# Patient Record
Sex: Female | Born: 1960 | Race: Black or African American | Hispanic: No | Marital: Single | Smoking: Former smoker
Health system: Southern US, Community
[De-identification: ages and names within clinical notes are randomized; demographics above are authoritative.]

## PROBLEM LIST (undated history)

## (undated) DIAGNOSIS — G20A1 Parkinson's disease without dyskinesia, without mention of fluctuations: Secondary | ICD-10-CM

## (undated) DIAGNOSIS — H04129 Dry eye syndrome of unspecified lacrimal gland: Secondary | ICD-10-CM

## (undated) DIAGNOSIS — I639 Cerebral infarction, unspecified: Secondary | ICD-10-CM

## (undated) DIAGNOSIS — B351 Tinea unguium: Secondary | ICD-10-CM

## (undated) DIAGNOSIS — J45909 Unspecified asthma, uncomplicated: Secondary | ICD-10-CM

## (undated) DIAGNOSIS — J449 Chronic obstructive pulmonary disease, unspecified: Secondary | ICD-10-CM

## (undated) DIAGNOSIS — G20 Parkinson's disease: Secondary | ICD-10-CM

## (undated) DIAGNOSIS — F1910 Other psychoactive substance abuse, uncomplicated: Secondary | ICD-10-CM

## (undated) HISTORY — DX: Tinea unguium: B35.1

## (undated) HISTORY — DX: Dry eye syndrome of unspecified lacrimal gland: H04.129

## (undated) HISTORY — DX: Other psychoactive substance abuse, uncomplicated: F19.10

---

## 2015-11-26 DIAGNOSIS — G609 Hereditary and idiopathic neuropathy, unspecified: Secondary | ICD-10-CM

## 2015-11-26 HISTORY — DX: Hereditary and idiopathic neuropathy, unspecified: G60.9

## 2018-09-13 DIAGNOSIS — L219 Seborrheic dermatitis, unspecified: Secondary | ICD-10-CM

## 2018-09-13 HISTORY — DX: Seborrheic dermatitis, unspecified: L21.9

## 2021-05-02 ENCOUNTER — Emergency Department (HOSPITAL_COMMUNITY)
Admission: EM | Admit: 2021-05-02 | Discharge: 2021-05-02 | Disposition: A | Discharge status: Home / Self Care | Payer: Medicaid Other | Attending: Emergency Medicine | Admitting: Emergency Medicine

## 2021-05-02 ENCOUNTER — Encounter (HOSPITAL_COMMUNITY): Payer: Self-pay | Admitting: Emergency Medicine

## 2021-05-02 ENCOUNTER — Emergency Department (HOSPITAL_COMMUNITY): Payer: Medicaid Other

## 2021-05-02 ENCOUNTER — Other Ambulatory Visit: Payer: Self-pay

## 2021-05-02 DIAGNOSIS — G20 Parkinson's disease: Secondary | ICD-10-CM | POA: Insufficient documentation

## 2021-05-02 DIAGNOSIS — M25512 Pain in left shoulder: Secondary | ICD-10-CM | POA: Diagnosis not present

## 2021-05-02 DIAGNOSIS — M25531 Pain in right wrist: Secondary | ICD-10-CM | POA: Diagnosis not present

## 2021-05-02 DIAGNOSIS — R202 Paresthesia of skin: Secondary | ICD-10-CM | POA: Diagnosis not present

## 2021-05-02 DIAGNOSIS — Z87891 Personal history of nicotine dependence: Secondary | ICD-10-CM | POA: Diagnosis not present

## 2021-05-02 DIAGNOSIS — M2550 Pain in unspecified joint: Secondary | ICD-10-CM

## 2021-05-02 DIAGNOSIS — J449 Chronic obstructive pulmonary disease, unspecified: Secondary | ICD-10-CM | POA: Insufficient documentation

## 2021-05-02 DIAGNOSIS — J45909 Unspecified asthma, uncomplicated: Secondary | ICD-10-CM | POA: Diagnosis not present

## 2021-05-02 HISTORY — DX: Cerebral infarction, unspecified: I63.9

## 2021-05-02 HISTORY — DX: Parkinson's disease: G20

## 2021-05-02 HISTORY — DX: Parkinson's disease without dyskinesia, without mention of fluctuations: G20.A1

## 2021-05-02 HISTORY — DX: Unspecified asthma, uncomplicated: J45.909

## 2021-05-02 HISTORY — DX: Chronic obstructive pulmonary disease, unspecified: J44.9

## 2021-05-02 LAB — COMPREHENSIVE METABOLIC PANEL
ALT: 5 U/L (ref 0–44)
AST: 15 U/L (ref 15–41)
Albumin: 4 g/dL (ref 3.5–5.0)
Alkaline Phosphatase: 59 U/L (ref 38–126)
Anion gap: 10 (ref 5–15)
BUN: 9 mg/dL (ref 8–23)
CO2: 23 mmol/L (ref 22–32)
Calcium: 9.4 mg/dL (ref 8.9–10.3)
Chloride: 102 mmol/L (ref 98–111)
Creatinine, Ser: 0.89 mg/dL (ref 0.44–1.00)
GFR, Estimated: >60 mL/min (ref >60)
Glucose, Bld: 123 mg/dL — ABNORMAL HIGH (ref 70–99)
Potassium: 3.4 mmol/L — ABNORMAL LOW (ref 3.5–5.1)
Sodium: 135 mmol/L (ref 135–145)
Total Bilirubin: 1.4 mg/dL — ABNORMAL HIGH (ref 0.3–1.2)
Total Protein: 7.4 g/dL (ref 6.5–8.1)

## 2021-05-02 LAB — ETHANOL: Alcohol, Ethyl (B): <10 mg/dL (ref <10)

## 2021-05-02 LAB — DIFFERENTIAL
Abs Immature Granulocytes: 0.05 10*3/uL (ref 0.00–0.07)
Basophils Absolute: 0 10*3/uL (ref 0.0–0.1)
Basophils Relative: 0 %
Eosinophils Absolute: 0 10*3/uL (ref 0.0–0.5)
Eosinophils Relative: 0 %
Immature Granulocytes: 1 %
Lymphocytes Relative: 9 %
Lymphs Abs: 0.9 10*3/uL (ref 0.7–4.0)
Monocytes Absolute: 0.8 10*3/uL (ref 0.1–1.0)
Monocytes Relative: 8 %
Neutro Abs: 8.5 10*3/uL — ABNORMAL HIGH (ref 1.7–7.7)
Neutrophils Relative %: 82 %

## 2021-05-02 LAB — PROTIME-INR
INR: 1.1 (ref 0.8–1.2)
Prothrombin Time: 14.4 seconds (ref 11.4–15.2)

## 2021-05-02 LAB — CBC
HCT: 38 % (ref 36.0–46.0)
Hemoglobin: 12.8 g/dL (ref 12.0–15.0)
MCH: 28.8 pg (ref 26.0–34.0)
MCHC: 33.7 g/dL (ref 30.0–36.0)
MCV: 85.6 fL (ref 80.0–100.0)
Platelets: 265 10*3/uL (ref 150–400)
RBC: 4.44 MIL/uL (ref 3.87–5.11)
RDW: 12.9 % (ref 11.5–15.5)
WBC: 10.3 10*3/uL (ref 4.0–10.5)
nRBC: 0 % (ref 0.0–0.2)

## 2021-05-02 LAB — I-STAT CHEM 8, ED
BUN: 9 mg/dL (ref 8–23)
Calcium, Ion: 1.19 mmol/L (ref 1.15–1.40)
Chloride: 102 mmol/L (ref 98–111)
Creatinine, Ser: 0.8 mg/dL (ref 0.44–1.00)
Glucose, Bld: 108 mg/dL — ABNORMAL HIGH (ref 70–99)
HCT: 37 % (ref 36.0–46.0)
Hemoglobin: 12.6 g/dL (ref 12.0–15.0)
Potassium: 3.5 mmol/L (ref 3.5–5.1)
Sodium: 138 mmol/L (ref 135–145)
TCO2: 23 mmol/L (ref 22–32)

## 2021-05-02 LAB — TROPONIN I (HIGH SENSITIVITY): Troponin I (High Sensitivity): 5 ng/L (ref <18)

## 2021-05-02 LAB — APTT: aPTT: 31 seconds (ref 24–36)

## 2021-05-02 LAB — SEDIMENTATION RATE: Sed Rate: 28 mm/hr — ABNORMAL HIGH (ref 0–22)

## 2021-05-02 MED ORDER — PREDNISONE 20 MG PO TABS
40.0000 mg | ORAL_TABLET | Freq: Once | ORAL | Status: AC
  Administered 2021-05-02: 40 mg via ORAL
  Filled 2021-05-02: qty 2

## 2021-05-02 MED ORDER — HYDROCODONE-ACETAMINOPHEN 5-325 MG PO TABS
1.0000 | ORAL_TABLET | Freq: Once | ORAL | Status: AC
  Administered 2021-05-02: 1 via ORAL
  Filled 2021-05-02: qty 1

## 2021-05-02 MED ORDER — PREDNISONE 20 MG PO TABS: 40.0000 mg | ORAL_TABLET | Freq: Every day | ORAL | 0 refills | Status: DC

## 2021-05-02 MED ORDER — OXYCODONE-ACETAMINOPHEN 5-325 MG PO TABS: 1.0000 | ORAL_TABLET | Freq: Four times a day (QID) | ORAL | 0 refills | Status: DC | PRN

## 2021-05-02 NOTE — ED Notes (Signed)
Discharge instructions gone over with patient. Provided pt with graham crackers and cranberry juice per pt request.

## 2021-05-02 NOTE — ED Notes (Signed)
Patient transported to X-ray 

## 2021-05-02 NOTE — ED Provider Notes (Signed)
[facility] EMERGENCY DEPARTMENT Provider Note   CSN: [id number] Arrival date & time: 05/02/21  1447     History Chief Complaint  Patient presents with  . Extremity Weakness  . Numbness    Madai Nuccio is a 60 y.o. female.  Patient is a 60 year old female with a history of COPD, Parkinson's disease since 2018 who is presenting today with several complaints.  Patient reports 4 days ago she started developing pain in her right wrist which initially went away for approximately 24 hours and then returned yesterday and this morning when she woke up there was also pain in her left shoulder that radiates down her arm.  She has noticed that today because of the pain being so severe she is had a difficult time holding her coffee cup because it feels too heavy and causes more pain as well as trying to get in and out of her car has been difficult because her upper body is painful.  She has no neck pain, chest pain, back pain.  She has not had fever cough or congestion.  She thinks that her wrist has looked swollen but has not been red.  No prior history of gout or recent procedures done to these areas.  She has no prior history of arthritis but reports that she does box which is a repetitive movement but does not remember injuring her arm while doing this.  She was trying to play tennis with her grandson today but her arm was hurting so badly she was unable.  She feels like it is tingling but denies any numbness.  She reports it feels weak all over but then states that it is pain.  She tried taking Tylenol yesterday but it did not help.  No new medications.  She is still taking her carbidopa levodopa.  The history is provided by the patient.  Extremity Weakness       Past Medical History:  Diagnosis Date  . Asthma   . COPD (chronic obstructive pulmonary disease) (HCC)   . Parkinson disease (HCC)   . Stroke [facility])     There are no problems to display for this  patient.   Past Surgical History:  Procedure Laterality Date  . CESAREAN SECTION       OB History   No obstetric history on file.     No family history on file.  Social History   Tobacco Use  . Smoking status: Former Games developer  . Smokeless tobacco: Never Used  Substance Use Topics  . Alcohol use: Yes  . Drug use: Never    Home Medications Prior to Admission medications   Medication Sig Start Date End Date Taking? Authorizing Provider  albuterol (VENTOLIN HFA) 108 (90 Base) MCG/ACT inhaler Inhale 1 puff into the lungs every 6 (six) hours as needed for shortness of breath. 05/12/19  Yes [provider]  carbidopa-levodopa (SINEMET IR) 25-100 MG tablet Take 1 tablet by mouth 3 (three) times daily. 03/08/20  Yes [provider]  cycloSPORINE (RESTASIS) 0.05 % ophthalmic emulsion Place 1 drop into both eyes daily. 03/14/21 06/12/21 Yes [provider]  trihexyphenidyl (ARTANE) 2 MG tablet Take 2 mg by mouth 3 (three) times daily with meals. 03/08/20  Yes [provider]    Allergies    Patient has no known allergies.  Review of Systems   Review of Systems  Musculoskeletal: Positive for extremity weakness.  All other systems reviewed and are negative.   Physical Exam Updated Vital  Signs BP 134/79 (BP Location: Right Arm)   Pulse 60   Temp 98.3 F (36.8 C) (Oral)   Resp 19   SpO2 100%   Physical Exam Vitals and nursing note reviewed.  Constitutional:      General: She is not in acute distress.    Appearance: Normal appearance. She is well-developed.  HENT:     Head: Normocephalic and atraumatic.  Eyes:     Pupils: Pupils are equal, round, and reactive to light.  Cardiovascular:     Rate and Rhythm: Normal rate and regular rhythm.     Heart sounds: Normal heart sounds. No murmur heard. No friction rub.  Pulmonary:     Effort: Pulmonary effort is normal.     Breath sounds: Normal breath sounds. No wheezing or rales.  Abdominal:      General: Bowel sounds are normal. There is no distension.     Palpations: Abdomen is soft.     Tenderness: There is no abdominal tenderness. There is no guarding or rebound.  Musculoskeletal:        General: Tenderness present. Normal range of motion.     Cervical back: Normal range of motion and neck supple.     Comments: No edema.  Rigidity with passive flexion and extension of the right wrist with minimal pain but no warmth or erythema.  Tenderness with palpation of the left shoulder with mild decreased range of motion.  No significant joint swelling noted in any of the joints of the upper extremity.  2+ radial pulse bilaterally.  Skin:    General: Skin is warm and dry.     Findings: No rash.  Neurological:     Mental Status: She is alert and oriented to person, place, and time.     Cranial Nerves: No cranial nerve deficit.     Comments: Resting pill-rolling tremor and slowed movements.  Mild rigidity with passive range of motion of the joints.  Mild decreased handgrip strength bilaterally, 4 out of 5.  5 out of 5 strength in bilateral lower extremities.  Psychiatric:        Mood and Affect: Mood normal.        Behavior: Behavior normal.     ED Results / Procedures / Treatments   Labs (all labs ordered are listed, but only abnormal results are displayed) Labs Reviewed  DIFFERENTIAL - Abnormal; Notable for the following components:      Result Value   Neutro Abs 8.5 (*)    All other components within normal limits  COMPREHENSIVE METABOLIC PANEL - Abnormal; Notable for the following components:   Potassium 3.4 (*)    Glucose, Bld 123 (*)    Total Bilirubin 1.4 (*)    All other components within normal limits  SEDIMENTATION RATE - Abnormal; Notable for the following components:   Sed Rate 28 (*)    All other components within normal limits  I-STAT CHEM 8, ED - Abnormal; Notable for the following components:   Glucose, Bld 108 (*)    All other components within normal limits   ETHANOL  PROTIME-INR  APTT  CBC  RAPID URINE DRUG SCREEN, HOSP PERFORMED  URINALYSIS, ROUTINE W REFLEX MICROSCOPIC  TROPONIN I (HIGH SENSITIVITY)  TROPONIN I (HIGH SENSITIVITY)    EKG EKG Interpretation  Date/Time:  Thursday May 02 2021 14:52:20 EDT Ventricular Rate:  73 PR Interval:  122 QRS Duration: 74 QT Interval:  364 QTC Calculation: 401 R Axis:   86 Text Interpretation: Normal sinus  rhythm T wave abnormality, consider inferior ischemia T wave abnormality, consider anterolateral ischemia No previous tracing Confirmed by Gwyneth Sprout ([id number]) on 05/02/2021 7:07:31 PM   Radiology DG Wrist Complete Right  Result Date: 05/02/2021 CLINICAL DATA:  Arm numbness EXAM: RIGHT WRIST - COMPLETE 3+ VIEW COMPARISON:  None. FINDINGS: There is no evidence of fracture or dislocation. There is no evidence of arthropathy or other focal bone abnormality. Soft tissues are unremarkable. IMPRESSION: No acute abnormality noted. Electronically Signed   By: Alcide Clever M.D.   On: 05/02/2021 20:33   CT Head Wo Contrast  Result Date: 05/02/2021 CLINICAL DATA:  Transient ischemic attack, bilateral arm weakness worse on the LEFT, history of transient ischemic attack in a 60 year old female. EXAM: CT HEAD WITHOUT CONTRAST TECHNIQUE: Contiguous axial images were obtained from the base of the skull through the vertex without intravenous contrast. COMPARISON:  None. FINDINGS: Brain: No evidence of acute infarction, hemorrhage, hydrocephalus, extra-axial collection or mass lesion/mass effect. Findings of empty sella, reported on prior MRI and CT evaluations dating back to 2016 from [facility]. Vascular: No hyperdense vessel or unexpected calcification. Skull: Normal. Negative for fracture or focal lesion. Sinuses/Orbits: No acute finding. Other: None. IMPRESSION: No acute intracranial abnormality. Electronically Signed   By: Donzetta Kohut M.D.   On: 05/02/2021 16:45    DG Shoulder Left  Result Date: 05/02/2021 CLINICAL DATA:  Shoulder pain, initial encounter EXAM: LEFT SHOULDER - 2+ VIEW COMPARISON:  None. FINDINGS: Mild degenerative changes of the acromioclavicular joint are noted. No acute fracture or dislocation is seen. Underlying bony thorax is within normal limits. IMPRESSION: No acute abnormality is noted. Electronically Signed   By: Alcide Clever M.D.   On: 05/02/2021 20:34    Procedures Procedures   Medications Ordered in ED Medications  HYDROcodone-acetaminophen (NORCO/VICODIN) 5-325 MG per tablet 1 tablet (has no administration in time range)    ED Course  I have reviewed the triage vital signs and the nursing notes.  Pertinent labs & imaging results that were available during my care of the patient were reviewed by me and considered in my medical decision making (see chart for details).    MDM Rules/Calculators/A&P                          60 year old female presenting today with pain in her upper extremities.  She has no neck pain or radiation of pain from the neck down.  She has minimal decreased grip bilaterally but reports pain is inhibiting further grip.  Sensation is intact.  Patient is able to ambulate with no notable weakness in the lower extremities.  Patient does not have findings concerning for gout, septic arthritis.  Low suspicion for gonococcus.  Patient has no findings consistent with stroke as symptoms are bilateral and more related to pain.  She does have findings consistent with Parkinson's but seems to be more of her baseline.  Concern for inflammation due to recurrent movements as patient has been boxing.  Plain films without acute findings, head CT negative, labs are reassuring with negative troponin, CBC and BMP.  Coags are within normal limits.  Sed rate is pending.  Patient's EKG does show anterior T wave inversions however no old in our system but when looking back at [facility] she had an EKG done last year that also reported  T wave inversion and suspect this is most likely old.  Patient given pain control.  9:49 PM Plain films wnl.  Sed rat age adjusted normal.  Will d/c home with short course of steroids and pain meds.  Will have  F/u with pcp or return to the ER if symptoms worsening.  MDM Number of Diagnoses or Management Options   Amount and/or Complexity of Data Reviewed Clinical lab tests: ordered and reviewed Tests in the radiology section of CPT: ordered and reviewed Tests in the medicine section of CPT: reviewed and ordered Independent visualization of images, tracings, or specimens: yes  Patient Progress Patient progress: stable   Final Clinical Impression(s) / ED Diagnoses Final diagnoses:  Paresthesias  Polyarthralgia    Rx / DC Orders ED Discharge Orders         Ordered    oxyCODONE-acetaminophen (PERCOCET/ROXICET) 5-325 MG tablet  Every 6 hours PRN        05/02/21 2152    predniSONE (DELTASONE) 20 MG tablet  Daily        05/02/21 2152           Gwyneth SproutPlunkett, Shaylen Nephew, MD 05/02/21 2154

## 2021-05-02 NOTE — ED Notes (Signed)
Back to room.

## 2021-05-02 NOTE — Discharge Instructions (Addendum)
Your blood work today looked good.  Your x-ray of your wrist was normal and your shoulder x-rayHad a little bit of arthritis.  Everything with your heart was normal and your head CT showed no signs of stroke.  The pain you are experiencing may be related to the boxing or overdoing it.  Avoid any boxing or repetitive movements.  If you have worsening symptoms, fever, redness or swelling of the joints you should return to the emergency room.  Otherwise you will need to follow-up with neurology and a regular doctor here.

## 2021-05-02 NOTE — ED Provider Notes (Signed)
Emergency Medicine Provider Triage Evaluation Note  Sarah Mcconnell , a 60 y.o. female  was evaluated in triage.  Pt complains of numbness and pain in her extremities since 8 PM on 05/01/2021.  States that the symptoms suddenly occurred last night.  Reports having pain and paresthesias to her right hand and her left arm.  States that when she tries to move her upper extremity she has pain.  She denies any head injury, injury or trauma, recent head injury, vision changes.  Denies any loss of vision.  She feels as though the symptoms are more paresthesias than actual loss of sensation.  Review of Systems  Positive: Paresthesias, extremity pain Negative: Headache, vision changes  Physical Exam  BP 123/78   Pulse (!) 51   Temp 98.9 F (37.2 C) (Oral)   Resp 16   SpO2 97%  Gen:   Awake, no distress   Resp:  Normal effort MSK:   Moves extremities without difficulty Other:   No facial asymmetry.  Patient reports pain with movement of bilateral upper extremities giving poor effort when assessing grip strength.  Normal sensation to light touch of face, bilateral upper and lower extremities.  No aphasia.  Symmetric tongue protrusion  Medical Decision Making  Medically screening exam initiated at 2:59 PM.  Appropriate orders placed.  Austyn Garcialopez was informed that the remainder of the evaluation will be completed by another provider, this initial triage assessment does not replace that evaluation, and the importance of remaining in the ED until their evaluation is complete.  No criteria for code stroke met at this time due to timing and symptoms but will initiate work-up and imaging   Khatri, Hina, PA-C 05/02/21 1502    Ray, Danielle, MD 05/06/21 1408  

## 2021-05-02 NOTE — ED Triage Notes (Signed)
Pt presents with difficulty getting off the toilet last night around 8 pm. She reports left upper arm numbness and right forearm numbness. Sensation intact bilaterally, no facial drooping or headache. Clear speech, no visual disturbances. A/o x 4

## 2021-06-04 ENCOUNTER — Emergency Department (HOSPITAL_COMMUNITY)
Admission: EM | Admit: 2021-06-04 | Discharge: 2021-06-05 | Disposition: A | Discharge status: Home / Self Care | Payer: Medicaid Other | Attending: Emergency Medicine | Admitting: Emergency Medicine

## 2021-06-04 ENCOUNTER — Other Ambulatory Visit: Payer: Self-pay

## 2021-06-04 ENCOUNTER — Encounter (HOSPITAL_COMMUNITY): Payer: Self-pay | Admitting: Emergency Medicine

## 2021-06-04 DIAGNOSIS — N390 Urinary tract infection, site not specified: Secondary | ICD-10-CM | POA: Diagnosis not present

## 2021-06-04 DIAGNOSIS — Z20822 Contact with and (suspected) exposure to covid-19: Secondary | ICD-10-CM | POA: Diagnosis not present

## 2021-06-04 DIAGNOSIS — J449 Chronic obstructive pulmonary disease, unspecified: Secondary | ICD-10-CM | POA: Insufficient documentation

## 2021-06-04 DIAGNOSIS — M79622 Pain in left upper arm: Secondary | ICD-10-CM | POA: Insufficient documentation

## 2021-06-04 DIAGNOSIS — M2550 Pain in unspecified joint: Secondary | ICD-10-CM

## 2021-06-04 DIAGNOSIS — M79621 Pain in right upper arm: Secondary | ICD-10-CM | POA: Diagnosis not present

## 2021-06-04 DIAGNOSIS — R070 Pain in throat: Secondary | ICD-10-CM | POA: Insufficient documentation

## 2021-06-04 DIAGNOSIS — Z87891 Personal history of nicotine dependence: Secondary | ICD-10-CM | POA: Diagnosis not present

## 2021-06-04 DIAGNOSIS — J45909 Unspecified asthma, uncomplicated: Secondary | ICD-10-CM | POA: Insufficient documentation

## 2021-06-04 NOTE — ED Provider Notes (Signed)
Emergency Medicine Provider Triage Evaluation Note  Sarah Mcconnell , a 60 y.o. female  was evaluated in triage.  Pt complains of joint pain.  States all of her joints hurt.  She was seen here about 1 month ago and told she had arthritis and started on prednisone.  States she is still having pain.  Denies fever, chills, sweats, cough, URI symptoms, vomiting, diarrhea.  Review of Systems  Positive: Joint pains Negative: Chest pain, SOB  Physical Exam  BP 132/72 (BP Location: Left Arm)   Pulse 61   Temp 99.7 F (37.6 C) (Oral)   Resp 18   SpO2 95%   Gen:   Awake, no distress   Resp:  Normal effort  MSK:   Moves extremities without difficulty, ambulatory with steady gait  Medical Decision Making  Medically screening exam initiated at 11:29 PM.  Appropriate orders placed.  Sarah Mcconnell was informed that the remainder of the evaluation will be completed by another provider, this initial triage assessment does not replace that evaluation, and the importance of remaining in the ED until their evaluation is complete.  Will checks labs, UA.   Sarah Hatchet, PA-C 06/04/21 2334    Sarah Conn, MD 06/05/21 [phone]

## 2021-06-04 NOTE — ED Triage Notes (Signed)
Pt c/o generalized joint pain. Denies fever/cough.

## 2021-06-05 LAB — URINALYSIS, ROUTINE W REFLEX MICROSCOPIC
Bilirubin Urine: NEGATIVE
Glucose, UA: NEGATIVE mg/dL
Hgb urine dipstick: NEGATIVE
Ketones, ur: 5 mg/dL — AB
Nitrite: NEGATIVE
Protein, ur: NEGATIVE mg/dL
Specific Gravity, Urine: 1.013 (ref 1.005–1.030)
pH: 6 (ref 5.0–8.0)

## 2021-06-05 LAB — CBC WITH DIFFERENTIAL/PLATELET
Abs Immature Granulocytes: 0.04 10*3/uL (ref 0.00–0.07)
Basophils Absolute: 0 10*3/uL (ref 0.0–0.1)
Basophils Relative: 0 %
Eosinophils Absolute: 0 10*3/uL (ref 0.0–0.5)
Eosinophils Relative: 0 %
HCT: 37.5 % (ref 36.0–46.0)
Hemoglobin: 12.4 g/dL (ref 12.0–15.0)
Immature Granulocytes: 0 %
Lymphocytes Relative: 8 %
Lymphs Abs: 0.7 10*3/uL (ref 0.7–4.0)
MCH: 29.3 pg (ref 26.0–34.0)
MCHC: 33.1 g/dL (ref 30.0–36.0)
MCV: 88.7 fL (ref 80.0–100.0)
Monocytes Absolute: 0.6 10*3/uL (ref 0.1–1.0)
Monocytes Relative: 6 %
Neutro Abs: 8.4 10*3/uL — ABNORMAL HIGH (ref 1.7–7.7)
Neutrophils Relative %: 86 %
Platelets: 337 10*3/uL (ref 150–400)
RBC: 4.23 MIL/uL (ref 3.87–5.11)
RDW: 12.9 % (ref 11.5–15.5)
WBC: 9.8 10*3/uL (ref 4.0–10.5)
nRBC: 0 % (ref 0.0–0.2)

## 2021-06-05 LAB — BASIC METABOLIC PANEL
Anion gap: 8 (ref 5–15)
BUN: 7 mg/dL — ABNORMAL LOW (ref 8–23)
CO2: 25 mmol/L (ref 22–32)
Calcium: 9.1 mg/dL (ref 8.9–10.3)
Chloride: 101 mmol/L (ref 98–111)
Creatinine, Ser: 0.84 mg/dL (ref 0.44–1.00)
GFR, Estimated: >60 mL/min (ref >60)
Glucose, Bld: 176 mg/dL — ABNORMAL HIGH (ref 70–99)
Potassium: 3.7 mmol/L (ref 3.5–5.1)
Sodium: 134 mmol/L — ABNORMAL LOW (ref 135–145)

## 2021-06-05 LAB — RESP PANEL BY RT-PCR (FLU A&B, COVID) ARPGX2
Influenza A by PCR: NEGATIVE
Influenza B by PCR: NEGATIVE
SARS Coronavirus 2 by RT PCR: NEGATIVE

## 2021-06-05 MED ORDER — CEPHALEXIN 500 MG PO CAPS: 500.0000 mg | ORAL_CAPSULE | Freq: Four times a day (QID) | ORAL | 0 refills | Status: AC

## 2021-06-05 MED ORDER — PREDNISONE 10 MG PO TABS: 20.0000 mg | ORAL_TABLET | Freq: Every day | ORAL | 0 refills | Status: DC

## 2021-06-05 MED ORDER — IBUPROFEN 800 MG PO TABS
800.0000 mg | ORAL_TABLET | Freq: Once | ORAL | Status: AC
  Administered 2021-06-05: 800 mg via ORAL
  Filled 2021-06-05: qty 2

## 2021-06-05 MED ORDER — PREDNISONE 20 MG PO TABS
20.0000 mg | ORAL_TABLET | Freq: Once | ORAL | Status: AC
  Administered 2021-06-05: 20 mg via ORAL
  Filled 2021-06-05: qty 1

## 2021-06-05 MED ORDER — CEPHALEXIN 250 MG PO CAPS
500.0000 mg | ORAL_CAPSULE | Freq: Once | ORAL | Status: AC
  Administered 2021-06-05: 500 mg via ORAL
  Filled 2021-06-05: qty 2

## 2021-06-05 NOTE — ED Provider Notes (Signed)
[facility] EMERGENCY DEPARTMENT Provider Note   CSN: [id number] Arrival date & time: 06/04/21  2300     History Chief Complaint  Patient presents with   Joint Pain    Sarah Mcconnell is a 60 y.o. female.  The history is provided by the patient and medical records.  Sarah Mcconnell is a 60 y.o. female who presents to the Emergency Department complaining of joint pain.  She presents to the ED complaining of several days of aching and throbbing pain to bilateral arms. She states that the pain is migratory and is located in her shoulders, elbows and wrist. Pain is worse with movement. She reports no fevers at home but does have a temperature to 100.3 in the department. No cough, difficulty breathing. She does have mild throat discomfort. No nausea, vomiting, diarrhea, dysuria, abdominal pain. She has a history of Parkinson's. She was recently treated with a course of steroids for joint pain and had significant improvement in her symptoms. When those steroids were discontinued her symptoms returned.     Past Medical History:  Diagnosis Date   Asthma    COPD (chronic obstructive pulmonary disease) (HCC)    Parkinson disease (HCC)    Stroke (HCC)     There are no problems to display for this patient.   Past Surgical History:  Procedure Laterality Date   CESAREAN SECTION       OB History   No obstetric history on file.     No family history on file.  Social History   Tobacco Use   Smoking status: Former    Pack years: 0.00   Smokeless tobacco: Never  Substance Use Topics   Alcohol use: Yes   Drug use: Never    Home Medications Prior to Admission medications   Medication Sig Start Date End Date Taking? Authorizing Provider  cephALEXin (KEFLEX) 500 MG capsule Take 1 capsule (500 mg total) by mouth 4 (four) times daily for 7 days. 06/05/21 06/12/21 Yes Tilden Fossa, MD  predniSONE (DELTASONE) 10 MG tablet Take 2 tablets (20 mg total) by mouth daily.  06/05/21  Yes Tilden Fossa, MD  albuterol (VENTOLIN HFA) 108 (90 Base) MCG/ACT inhaler Inhale 1 puff into the lungs every 6 (six) hours as needed for shortness of breath. 05/12/19   [provider]  carbidopa-levodopa (SINEMET IR) 25-100 MG tablet Take 1 tablet by mouth 3 (three) times daily. 03/08/20   [provider]  cycloSPORINE (RESTASIS) 0.05 % ophthalmic emulsion Place 1 drop into both eyes daily. 03/14/21 06/12/21  [provider]  oxyCODONE-acetaminophen (PERCOCET/ROXICET) 5-325 MG tablet Take 1 tablet by mouth every 6 (six) hours as needed for severe pain. 05/02/21   Gwyneth Sprout, MD  trihexyphenidyl (ARTANE) 2 MG tablet Take 2 mg by mouth 3 (three) times daily with meals. 03/08/20   [provider]    Allergies    Patient has no known allergies.  Review of Systems   Review of Systems  All other systems reviewed and are negative.  Physical Exam Updated Vital Signs BP 126/80 (BP Location: Left Arm)   Pulse 65   Temp 100.3 F (37.9 C) (Oral)   Resp 16   SpO2 100%   Physical Exam Vitals and nursing note reviewed.  Constitutional:      Appearance: She is well-developed.  HENT:     Head: Normocephalic and atraumatic.  Cardiovascular:     Rate and Rhythm: Normal rate and regular rhythm.     Heart sounds:  No murmur heard. Pulmonary:     Effort: Pulmonary effort is normal. No respiratory distress.     Breath sounds: Normal breath sounds.  Abdominal:     Palpations: Abdomen is soft.     Tenderness: There is no abdominal tenderness. There is no guarding or rebound.  Musculoskeletal:     Comments: She has mild tenderness to palpation throughout bilateral upper extremities. There is no overlying erythema. 2+ radial pulses bilaterally. She is unable to fully range the right shoulder. She is able to cross midline with bilateral upper extremities and she is able to fully range the left shoulder.  Skin:    General: Skin is warm and dry.   Neurological:     Mental Status: She is alert and oriented to person, place, and time.     Comments: Five out of five grip strength bilaterally. She does have a resting tremor to bilateral hands.  Psychiatric:        Behavior: Behavior normal.    ED Results / Procedures / Treatments   Labs (all labs ordered are listed, but only abnormal results are displayed) Labs Reviewed  CBC WITH DIFFERENTIAL/PLATELET - Abnormal; Notable for the following components:      Result Value   Neutro Abs 8.4 (*)    All other components within normal limits  BASIC METABOLIC PANEL - Abnormal; Notable for the following components:   Sodium 134 (*)    Glucose, Bld 176 (*)    BUN 7 (*)    All other components within normal limits  URINALYSIS, ROUTINE W REFLEX MICROSCOPIC - Abnormal; Notable for the following components:   APPearance HAZY (*)    Ketones, ur 5 (*)    Leukocytes,Ua TRACE (*)    Bacteria, UA MANY (*)    All other components within normal limits  RESP PANEL BY RT-PCR (FLU A&B, COVID) ARPGX2  URINE CULTURE    EKG None  Radiology No results found.  Procedures Procedures   Medications Ordered in ED Medications  cephALEXin (KEFLEX) capsule 500 mg (has no administration in time range)  predniSONE (DELTASONE) tablet 20 mg (has no administration in time range)  ibuprofen (ADVIL) tablet 800 mg (800 mg Oral Given 06/05/21 0848)    ED Course  I have reviewed the triage vital signs and the nursing notes.  Pertinent labs & imaging results that were available during my care of the patient were reviewed by me and considered in my medical decision making (see chart for details).    MDM Rules/Calculators/A&P                         patient here for evaluation of joint pains. She did recently have a steroid course for similar symptoms. No evidence of septic arthritis. UA is concerning for UTI, likely contributing to patients low-grade temperature. Will start on antibiotics for UTI as well as  low-dose steroids for her arthralgia. Discussed with patient importance of PCP follow-up, may need rheumatology referral. Return precautions discussed.  Final Clinical Impression(s) / ED Diagnoses Final diagnoses:  Polyarthralgia  Acute UTI    Rx / DC Orders ED Discharge Orders          Ordered    cephALEXin (KEFLEX) 500 MG capsule  4 times daily        06/05/21 1023    predniSONE (DELTASONE) 10 MG tablet  Daily        06/05/21 1023  Tilden Fossa, MD 06/05/21 1029

## 2021-06-06 LAB — URINE CULTURE: Culture: 50000 — AB

## 2021-07-09 ENCOUNTER — Other Ambulatory Visit: Payer: Self-pay | Admitting: Family Medicine

## 2021-07-09 DIAGNOSIS — Z1231 Encounter for screening mammogram for malignant neoplasm of breast: Secondary | ICD-10-CM

## 2021-07-10 ENCOUNTER — Other Ambulatory Visit: Payer: Self-pay | Admitting: Family Medicine

## 2021-07-10 DIAGNOSIS — M50021 Cervical disc disorder at C4-C5 level with myelopathy: Secondary | ICD-10-CM

## 2021-07-22 ENCOUNTER — Ambulatory Visit
Admission: RE | Admit: 2021-07-22 | Discharge: 2021-07-22 | Disposition: A | Discharge status: Home / Self Care | Payer: Medicaid Other | Source: Ambulatory Visit | Attending: Family Medicine | Admitting: Family Medicine

## 2021-07-22 ENCOUNTER — Other Ambulatory Visit: Payer: Self-pay

## 2021-07-22 DIAGNOSIS — M50021 Cervical disc disorder at C4-C5 level with myelopathy: Secondary | ICD-10-CM

## 2021-08-29 ENCOUNTER — Ambulatory Visit: Payer: Medicaid Other

## 2021-08-30 ENCOUNTER — Encounter: Payer: Medicaid Other | Admitting: Obstetrics and Gynecology

## 2021-09-24 ENCOUNTER — Ambulatory Visit: Payer: Medicaid Other | Admitting: Obstetrics and Gynecology

## 2021-09-24 ENCOUNTER — Encounter: Payer: Medicaid Other | Admitting: Obstetrics and Gynecology

## 2021-09-24 ENCOUNTER — Ambulatory Visit: Payer: Medicaid Other

## 2021-09-30 ENCOUNTER — Encounter: Payer: Self-pay | Admitting: *Deleted

## 2021-10-01 ENCOUNTER — Ambulatory Visit: Payer: Medicaid Other | Admitting: Neurology

## 2021-10-01 ENCOUNTER — Encounter: Payer: Self-pay | Admitting: Neurology

## 2021-10-02 ENCOUNTER — Ambulatory Visit: Payer: Medicaid Other | Admitting: Neurology

## 2021-10-02 ENCOUNTER — Ambulatory Visit: Payer: Medicaid Other

## 2021-10-07 ENCOUNTER — Other Ambulatory Visit: Payer: Self-pay

## 2021-10-07 ENCOUNTER — Ambulatory Visit: Payer: Medicaid Other | Admitting: Obstetrics and Gynecology

## 2021-10-07 ENCOUNTER — Inpatient Hospital Stay (HOSPITAL_COMMUNITY)
Admission: EM | Admit: 2021-10-07 | Discharge: 2021-10-13 | DRG: 872 | Disposition: A | Discharge status: Home / Self Care | Payer: Medicaid Other | Attending: Internal Medicine | Admitting: Internal Medicine

## 2021-10-07 ENCOUNTER — Emergency Department (HOSPITAL_COMMUNITY): Payer: Medicaid Other

## 2021-10-07 ENCOUNTER — Ambulatory Visit: Payer: Medicaid Other

## 2021-10-07 DIAGNOSIS — G609 Hereditary and idiopathic neuropathy, unspecified: Secondary | ICD-10-CM | POA: Diagnosis present

## 2021-10-07 DIAGNOSIS — A419 Sepsis, unspecified organism: Principal | ICD-10-CM | POA: Diagnosis present

## 2021-10-07 DIAGNOSIS — Z87891 Personal history of nicotine dependence: Secondary | ICD-10-CM

## 2021-10-07 DIAGNOSIS — E876 Hypokalemia: Secondary | ICD-10-CM | POA: Diagnosis present

## 2021-10-07 DIAGNOSIS — Z20822 Contact with and (suspected) exposure to covid-19: Secondary | ICD-10-CM | POA: Diagnosis present

## 2021-10-07 DIAGNOSIS — J189 Pneumonia, unspecified organism: Secondary | ICD-10-CM

## 2021-10-07 DIAGNOSIS — R262 Difficulty in walking, not elsewhere classified: Secondary | ICD-10-CM | POA: Diagnosis present

## 2021-10-07 DIAGNOSIS — J449 Chronic obstructive pulmonary disease, unspecified: Secondary | ICD-10-CM | POA: Diagnosis present

## 2021-10-07 DIAGNOSIS — Z8673 Personal history of transient ischemic attack (TIA), and cerebral infarction without residual deficits: Secondary | ICD-10-CM

## 2021-10-07 DIAGNOSIS — M25551 Pain in right hip: Secondary | ICD-10-CM | POA: Diagnosis present

## 2021-10-07 DIAGNOSIS — Z9189 Other specified personal risk factors, not elsewhere classified: Secondary | ICD-10-CM

## 2021-10-07 DIAGNOSIS — G20 Parkinson's disease: Secondary | ICD-10-CM | POA: Diagnosis present

## 2021-10-07 DIAGNOSIS — R509 Fever, unspecified: Secondary | ICD-10-CM

## 2021-10-07 DIAGNOSIS — M79651 Pain in right thigh: Secondary | ICD-10-CM

## 2021-10-07 DIAGNOSIS — Z8249 Family history of ischemic heart disease and other diseases of the circulatory system: Secondary | ICD-10-CM

## 2021-10-07 DIAGNOSIS — J4520 Mild intermittent asthma, uncomplicated: Secondary | ICD-10-CM | POA: Diagnosis present

## 2021-10-07 DIAGNOSIS — M25451 Effusion, right hip: Secondary | ICD-10-CM

## 2021-10-07 DIAGNOSIS — M009 Pyogenic arthritis, unspecified: Secondary | ICD-10-CM | POA: Diagnosis present

## 2021-10-07 DIAGNOSIS — R6510 Systemic inflammatory response syndrome (SIRS) of non-infectious origin without acute organ dysfunction: Secondary | ICD-10-CM | POA: Diagnosis present

## 2021-10-07 DIAGNOSIS — Z79899 Other long term (current) drug therapy: Secondary | ICD-10-CM

## 2021-10-07 MED ORDER — ACETAMINOPHEN 325 MG PO TABS
650.0000 mg | ORAL_TABLET | Freq: Once | ORAL | Status: AC
  Administered 2021-10-08: 650 mg via ORAL
  Filled 2021-10-07: qty 2

## 2021-10-07 MED ORDER — FENTANYL CITRATE PF 50 MCG/ML IJ SOSY: 50.0000 ug | PREFILLED_SYRINGE | Freq: Once | INTRAMUSCULAR | Status: DC

## 2021-10-07 NOTE — ED Provider Notes (Signed)
[facility]-EMERGENCY DEPT Provider Note   CSN: [id number] Arrival date & time: 10/07/21  1949     History Chief Complaint  Patient presents with   Hip Pain    Sarah Mcconnell is a 60 y.o. female.  Patient with a history of COPD and Parkinson's disease here with right hip pain.  Denies any fall or trauma.  States she feels weak in her right leg is having difficulty walking with unable to get off the toilet today.  Denies any fall or trauma.  States she was cleaning her house yesterday and noticed that she was weak in her right "hip" after this.  She describes pain to her right anterior thigh that radiates down her entire leg.  She has no weakness in the leg and unable to walk.  She has not been able to make it to the bathroom and has been going to the bathroom on herself but not incontinence that she knows it is coming.  Describes weakness and numbness in her leg and difficulty standing and.  No new weakness in her arms.  Febrile on arrival to the ED with a rectal temp of 100.5.  Did not know she had a fever.  No abdominal pain or vomiting.  No chest pain or shortness of breath.  No headache or visual changes.  No pain with urination or blood in the urine Patient has had a history of polyarthralgias in the past but never had this particular pain. No previous back surgery  The history is provided by the patient.  Hip Pain Pertinent negatives include no chest pain, no abdominal pain, no headaches and no shortness of breath.      Past Medical History:  Diagnosis Date   Asthma    COPD (chronic obstructive pulmonary disease) (HCC)    Dry eye syndrome    Onychomycosis    of finger nails   Parkinson disease (HCC)    Stroke (HCC)    Substance abuse (HCC)    crack cocaine and dependency, in remission since 2012    There are no problems to display for this patient.   Past Surgical History:  Procedure Laterality Date   CESAREAN SECTION       OB History   No  obstetric history on file.     No family history on file.  Social History   Tobacco Use   Smoking status: Former   Smokeless tobacco: Never  Substance Use Topics   Alcohol use: Yes   Drug use: Never    Home Medications Prior to Admission medications   Medication Sig Start Date End Date Taking? Authorizing Provider  albuterol (VENTOLIN HFA) 108 (90 Base) MCG/ACT inhaler Inhale 1 puff into the lungs every 6 (six) hours as needed for shortness of breath. 05/12/19   [provider]  carbidopa-levodopa (SINEMET IR) 25-100 MG tablet Take 1 tablet by mouth 3 (three) times daily. 03/08/20   [provider]  oxyCODONE-acetaminophen (PERCOCET/ROXICET) 5-325 MG tablet Take 1 tablet by mouth every 6 (six) hours as needed for severe pain. 05/02/21   Gwyneth Sprout, MD  predniSONE (DELTASONE) 10 MG tablet Take 2 tablets (20 mg total) by mouth daily. 06/05/21   Tilden Fossa, MD  trihexyphenidyl (ARTANE) 2 MG tablet Take 2 mg by mouth 3 (three) times daily with meals. 03/08/20   [provider]    Allergies    Patient has no known allergies.  Review of Systems   Review of Systems  Constitutional:  Positive  for activity change and fever. Negative for appetite change.  HENT:  Negative for congestion and nosebleeds.   Eyes:  Negative for photophobia.  Respiratory:  Negative for cough, chest tightness and shortness of breath.   Cardiovascular:  Negative for chest pain.  Gastrointestinal:  Negative for abdominal pain, nausea and vomiting.  Genitourinary:  Positive for difficulty urinating. Negative for dysuria.  Musculoskeletal:  Positive for arthralgias, back pain and myalgias.  Neurological:  Positive for weakness. Negative for dizziness, light-headedness and headaches.   all other systems are negative except as noted in the HPI and PMH.   Physical Exam Updated Vital Signs BP (!) 143/92 (BP Location: Right Arm)   Pulse 84   Temp 98 F (36.7 C) (Oral)   Resp 14    Ht 5\' 9"  (1.753 m)   Wt 81.6 kg   SpO2 98%   BMI 26.58 kg/m   Physical Exam Vitals and nursing note reviewed.  Constitutional:      General: She is not in acute distress.    Appearance: She is well-developed. She is ill-appearing.  HENT:     Head: Normocephalic and atraumatic.     Mouth/Throat:     Pharynx: No oropharyngeal exudate.  Eyes:     Conjunctiva/sclera: Conjunctivae normal.     Pupils: Pupils are equal, round, and reactive to light.  Neck:     Comments: No meningismus. Cardiovascular:     Rate and Rhythm: Normal rate and regular rhythm.     Heart sounds: Normal heart sounds. No murmur heard. Pulmonary:     Effort: Pulmonary effort is normal. No respiratory distress.     Breath sounds: Normal breath sounds.  Chest:     Chest wall: No tenderness.  Abdominal:     Palpations: Abdomen is soft.     Tenderness: There is no abdominal tenderness. There is no guarding or rebound.  Genitourinary:    Comments: Geologist, engineeringChaperone Ellen RN present.  Rectal tone is normal. Musculoskeletal:        General: Swelling and tenderness present.     Cervical back: Normal range of motion and neck supple.     Comments: Patient with pain with attempted range of motion of right hip and knee.  There is no overlying erythema or edema. Tenderness to right anterior lateral hip.  She is able to wiggle toes bilaterally.  Ankle flexion and extension are intact but weaker on R likely 2/2 pain.  Equal PT DP pulses.  Compartments are soft.  Unable to lift right leg off the bed.  She is able to hold her left leg off the bed. +2 patellar reflexes bilaterally  Ongoing resting tremors in upper extremities which are unchanged  Skin:    General: Skin is warm.  Neurological:     Mental Status: She is alert and oriented to person, place, and time.     Cranial Nerves: No cranial nerve deficit.     Motor: No abnormal muscle tone.     Coordination: Coordination normal.     Comments:  5/5 strength throughout. CN  2-12 intact.Equal grip strength.   Psychiatric:        Behavior: Behavior normal.    ED Results / Procedures / Treatments   Labs (all labs ordered are listed, but only abnormal results are displayed) Labs Reviewed  CBC WITH DIFFERENTIAL/PLATELET - Abnormal; Notable for the following components:      Result Value   WBC 12.1 (*)    Neutro Abs 10.1 (*)  All other components within normal limits  COMPREHENSIVE METABOLIC PANEL - Abnormal; Notable for the following components:   Potassium 3.4 (*)    Glucose, Bld 132 (*)    Total Bilirubin 1.3 (*)    All other components within normal limits  URINALYSIS, ROUTINE W REFLEX MICROSCOPIC - Abnormal; Notable for the following components:   Leukocytes,Ua TRACE (*)    Bacteria, UA RARE (*)    All other components within normal limits  SEDIMENTATION RATE - Abnormal; Notable for the following components:   Sed Rate 33 (*)    All other components within normal limits  RESP PANEL BY RT-PCR (FLU A&B, COVID) ARPGX2  CULTURE, BLOOD (ROUTINE X 2)  CULTURE, BLOOD (ROUTINE X 2)  LACTIC ACID, PLASMA  LACTIC ACID, PLASMA  C-REACTIVE PROTEIN    EKG None  Radiology CT HIP RIGHT W CONTRAST  Result Date: 10/08/2021 CLINICAL DATA:  Right hip pain, concern for septic arthritis EXAM: CT OF THE LOWER RIGHT EXTREMITY WITH CONTRAST TECHNIQUE: Multidetector CT imaging of the lower right extremity was performed according to the standard protocol following intravenous contrast administration. CONTRAST:  4mL OMNIPAQUE IOHEXOL 350 MG/ML SOLN COMPARISON:  None. FINDINGS: No fracture or dislocation is seen. Right hip joint space is preserved. Trace right hip joint fluid collection (series 4/images 36 and 43). Visualized soft tissues are otherwise unremarkable. IMPRESSION: Trace right hip effusion. No acute osseous abnormality is seen. Electronically Signed   By: Julian Hy M.D.   On: 10/08/2021 03:18   DG Hip Unilat W or Wo Pelvis 1 View Right  Result  Date: 10/07/2021 CLINICAL DATA:  Right hip pain. EXAM: DG HIP (WITH OR WITHOUT PELVIS) 1V RIGHT COMPARISON:  None. FINDINGS: There is no acute fracture or dislocation. Mild bilateral hip arthritic changes. The soft tissues are unremarkable. IMPRESSION: No acute fracture or dislocation. Electronically Signed   By: Anner Crete M.D.   On: 10/07/2021 21:00    Procedures Procedures   Medications Ordered in ED Medications - No data to display  ED Course  I have reviewed the triage vital signs and the nursing notes.  Pertinent labs & imaging results that were available during my care of the patient were reviewed by me and considered in my medical decision making (see chart for details).    MDM Rules/Calculators/A&P                           Right hip and leg pain without trauma.  Patient appears ill but fever, weakness, difficulty moving her right leg.  She is not truly incontinent of urine but cannot make it to the bathroom. Rectal tone is normal.   Concern for possibly septic hip versus spinal pathology.  Patient has fever and leukocytosis. Postvoid residual 133 mL.  Rectal tone is intact.   MRI is not available at this facility.  Discussed with [facility] emergency department and there is a several hour back up for MRI there as well.  MRI will be available here in 4 hours.  Obtain CT scan in the meantime for further evaluation of her pathology.  D/w Dr. Cyd Silence who will arrange for admission. He requests orthopedic notification.  Discussed with Dr. Doran Durand of orthopedics.  He agrees with hospitalist admission.  Prefer no antibiotics until hip after aspiration performed. Agrees with MRI in the morning.  CT does show trace effusion without fracture.  Final Clinical Imprn(s) / ED Diagnoses Final diagnoses:  Right hip pain  Fever, unspecified fever cause    Rx / DC Orders ED Discharge Orders     None        Shyler Holzman, Annie Main, MD 10/08/21 [phone]

## 2021-10-07 NOTE — ED Notes (Signed)
Pt was incontinent of urine.  Cleaned pt up.  Pt tender to touch on right thigh.  Right sided weakness, pt having some tremors in hands

## 2021-10-07 NOTE — ED Triage Notes (Signed)
Pt BIB EMS from home. Pt complains of right hip pain since last night. Pt states that she is unable to walk because of the pain.

## 2021-10-08 ENCOUNTER — Observation Stay (HOSPITAL_COMMUNITY): Payer: Medicaid Other

## 2021-10-08 ENCOUNTER — Encounter (HOSPITAL_COMMUNITY): Payer: Self-pay | Admitting: Internal Medicine

## 2021-10-08 DIAGNOSIS — M25551 Pain in right hip: Secondary | ICD-10-CM | POA: Diagnosis present

## 2021-10-08 DIAGNOSIS — Z9189 Other specified personal risk factors, not elsewhere classified: Secondary | ICD-10-CM | POA: Diagnosis not present

## 2021-10-08 DIAGNOSIS — G20 Parkinson's disease: Secondary | ICD-10-CM

## 2021-10-08 DIAGNOSIS — J449 Chronic obstructive pulmonary disease, unspecified: Secondary | ICD-10-CM | POA: Diagnosis not present

## 2021-10-08 DIAGNOSIS — A419 Sepsis, unspecified organism: Secondary | ICD-10-CM | POA: Diagnosis present

## 2021-10-08 DIAGNOSIS — E876 Hypokalemia: Secondary | ICD-10-CM

## 2021-10-08 DIAGNOSIS — R6510 Systemic inflammatory response syndrome (SIRS) of non-infectious origin without acute organ dysfunction: Secondary | ICD-10-CM

## 2021-10-08 DIAGNOSIS — G20A1 Parkinson's disease without dyskinesia, without mention of fluctuations: Secondary | ICD-10-CM | POA: Diagnosis present

## 2021-10-08 LAB — CBC WITH DIFFERENTIAL/PLATELET
Abs Immature Granulocytes: 0.04 10*3/uL (ref 0.00–0.07)
Abs Immature Granulocytes: 0.05 10*3/uL (ref 0.00–0.07)
Basophils Absolute: 0 10*3/uL (ref 0.0–0.1)
Basophils Absolute: 0 10*3/uL (ref 0.0–0.1)
Basophils Relative: 0 %
Basophils Relative: 0 %
Eosinophils Absolute: 0 10*3/uL (ref 0.0–0.5)
Eosinophils Absolute: 0 10*3/uL (ref 0.0–0.5)
Eosinophils Relative: 0 %
Eosinophils Relative: 0 %
HCT: 35.6 % — ABNORMAL LOW (ref 36.0–46.0)
HCT: 36.1 % (ref 36.0–46.0)
Hemoglobin: 11.8 g/dL — ABNORMAL LOW (ref 12.0–15.0)
Hemoglobin: 12.5 g/dL (ref 12.0–15.0)
Immature Granulocytes: 0 %
Immature Granulocytes: 0 %
Lymphocytes Relative: 14 %
Lymphocytes Relative: 7 %
Lymphs Abs: 0.8 10*3/uL (ref 0.7–4.0)
Lymphs Abs: 1.8 10*3/uL (ref 0.7–4.0)
MCH: 29 pg (ref 26.0–34.0)
MCH: 29.3 pg (ref 26.0–34.0)
MCHC: 33.1 g/dL (ref 30.0–36.0)
MCHC: 34.6 g/dL (ref 30.0–36.0)
MCV: 84.5 fL (ref 80.0–100.0)
MCV: 87.5 fL (ref 80.0–100.0)
Monocytes Absolute: 1 10*3/uL (ref 0.1–1.0)
Monocytes Absolute: 1.6 10*3/uL — ABNORMAL HIGH (ref 0.1–1.0)
Monocytes Relative: 13 %
Monocytes Relative: 9 %
Neutro Abs: 10.1 10*3/uL — ABNORMAL HIGH (ref 1.7–7.7)
Neutro Abs: 9.2 10*3/uL — ABNORMAL HIGH (ref 1.7–7.7)
Neutrophils Relative %: 73 %
Neutrophils Relative %: 84 %
Platelets: 227 10*3/uL (ref 150–400)
Platelets: 229 10*3/uL (ref 150–400)
RBC: 4.07 MIL/uL (ref 3.87–5.11)
RBC: 4.27 MIL/uL (ref 3.87–5.11)
RDW: 13.1 % (ref 11.5–15.5)
RDW: 13.2 % (ref 11.5–15.5)
WBC: 12.1 10*3/uL — ABNORMAL HIGH (ref 4.0–10.5)
WBC: 12.7 10*3/uL — ABNORMAL HIGH (ref 4.0–10.5)
nRBC: 0 % (ref 0.0–0.2)
nRBC: 0 % (ref 0.0–0.2)

## 2021-10-08 LAB — URINALYSIS, ROUTINE W REFLEX MICROSCOPIC
Bilirubin Urine: NEGATIVE
Glucose, UA: NEGATIVE mg/dL
Hgb urine dipstick: NEGATIVE
Ketones, ur: NEGATIVE mg/dL
Nitrite: NEGATIVE
Protein, ur: NEGATIVE mg/dL
Specific Gravity, Urine: 1.009 (ref 1.005–1.030)
pH: 7 (ref 5.0–8.0)

## 2021-10-08 LAB — COMPREHENSIVE METABOLIC PANEL
ALT: 14 U/L (ref 0–44)
AST: 17 U/L (ref 15–41)
Albumin: 3.9 g/dL (ref 3.5–5.0)
Alkaline Phosphatase: 58 U/L (ref 38–126)
Anion gap: 9 (ref 5–15)
BUN: 9 mg/dL (ref 8–23)
CO2: 23 mmol/L (ref 22–32)
Calcium: 9.3 mg/dL (ref 8.9–10.3)
Chloride: 103 mmol/L (ref 98–111)
Creatinine, Ser: 0.72 mg/dL (ref 0.44–1.00)
GFR, Estimated: >60 mL/min (ref >60)
Glucose, Bld: 132 mg/dL — ABNORMAL HIGH (ref 70–99)
Potassium: 3.4 mmol/L — ABNORMAL LOW (ref 3.5–5.1)
Sodium: 135 mmol/L (ref 135–145)
Total Bilirubin: 1.3 mg/dL — ABNORMAL HIGH (ref 0.3–1.2)
Total Protein: 7.5 g/dL (ref 6.5–8.1)

## 2021-10-08 LAB — SYNOVIAL CELL COUNT + DIFF, W/ CRYSTALS
Crystals, Fluid: NONE SEEN
Lymphocytes-Synovial Fld: 4 % (ref 0–20)
Monocyte-Macrophage-Synovial Fluid: 14 % — ABNORMAL LOW (ref 50–90)
Neutrophil, Synovial: 82 % — ABNORMAL HIGH (ref 0–25)
WBC, Synovial: UNDETERMINED /mm3 (ref 0–200)

## 2021-10-08 LAB — APTT: aPTT: 34 seconds (ref 24–36)

## 2021-10-08 LAB — HIV ANTIBODY (ROUTINE TESTING W REFLEX): HIV Screen 4th Generation wRfx: NONREACTIVE

## 2021-10-08 LAB — RESP PANEL BY RT-PCR (FLU A&B, COVID) ARPGX2
Influenza A by PCR: NEGATIVE
Influenza B by PCR: NEGATIVE
SARS Coronavirus 2 by RT PCR: NEGATIVE

## 2021-10-08 LAB — LACTIC ACID, PLASMA
Lactic Acid, Venous: 0.8 mmol/L (ref 0.5–1.9)
Lactic Acid, Venous: 1 mmol/L (ref 0.5–1.9)

## 2021-10-08 LAB — PROTIME-INR
INR: 1.3 — ABNORMAL HIGH (ref 0.8–1.2)
Prothrombin Time: 15.8 seconds — ABNORMAL HIGH (ref 11.4–15.2)

## 2021-10-08 LAB — SEDIMENTATION RATE: Sed Rate: 33 mm/hr — ABNORMAL HIGH (ref 0–22)

## 2021-10-08 LAB — C-REACTIVE PROTEIN: CRP: 8.8 mg/dL — ABNORMAL HIGH (ref <1.0)

## 2021-10-08 MED ORDER — ACETAMINOPHEN 650 MG RE SUPP: 650.0000 mg | Freq: Four times a day (QID) | RECTAL | Status: DC | PRN

## 2021-10-08 MED ORDER — MORPHINE SULFATE (PF) 2 MG/ML IV SOLN: 2.0000 mg | INTRAVENOUS | Status: DC | PRN

## 2021-10-08 MED ORDER — POLYETHYLENE GLYCOL 3350 17 G PO PACK: 17.0000 g | PACK | Freq: Every day | ORAL | Status: DC | PRN

## 2021-10-08 MED ORDER — LACTATED RINGERS IV BOLUS
1000.0000 mL | Freq: Once | INTRAVENOUS | Status: AC
  Administered 2021-10-08: 1000 mL via INTRAVENOUS

## 2021-10-08 MED ORDER — LIDOCAINE HCL 1 % IJ SOLN
INTRAMUSCULAR | Status: AC
  Administered 2021-10-08: 5 mL via INTRA_ARTICULAR
  Filled 2021-10-08: qty 20

## 2021-10-08 MED ORDER — CARBIDOPA-LEVODOPA 25-100 MG PO TABS
1.0000 | ORAL_TABLET | Freq: Two times a day (BID) | ORAL | Status: DC
Start: 2021-10-08 — End: 2021-10-13
  Administered 2021-10-08 – 2021-10-13 (×9): 1 via ORAL
  Filled 2021-10-08 (×11): qty 1

## 2021-10-08 MED ORDER — VANCOMYCIN HCL 1000 MG/200ML IV SOLN
1000.0000 mg | Freq: Two times a day (BID) | INTRAVENOUS | Status: DC
  Administered 2021-10-08 – 2021-10-13 (×9): 1000 mg via INTRAVENOUS
  Filled 2021-10-08 (×11): qty 200

## 2021-10-08 MED ORDER — IOHEXOL 350 MG/ML SOLN
80.0000 mL | Freq: Once | INTRAVENOUS | Status: AC | PRN
  Administered 2021-10-08: 80 mL via INTRAVENOUS

## 2021-10-08 MED ORDER — ONDANSETRON HCL 4 MG PO TABS: 4.0000 mg | ORAL_TABLET | Freq: Four times a day (QID) | ORAL | Status: DC | PRN

## 2021-10-08 MED ORDER — ALBUTEROL SULFATE (2.5 MG/3ML) 0.083% IN NEBU: 2.5000 mg | INHALATION_SOLUTION | RESPIRATORY_TRACT | Status: DC | PRN

## 2021-10-08 MED ORDER — LORAZEPAM 2 MG/ML IJ SOLN: 1.0000 mg | INTRAMUSCULAR | Status: AC

## 2021-10-08 MED ORDER — ONDANSETRON HCL 4 MG/2ML IJ SOLN: 4.0000 mg | Freq: Four times a day (QID) | INTRAMUSCULAR | Status: DC | PRN

## 2021-10-08 MED ORDER — ACETAMINOPHEN 325 MG PO TABS
650.0000 mg | ORAL_TABLET | Freq: Four times a day (QID) | ORAL | Status: DC | PRN
  Administered 2021-10-08 – 2021-10-13 (×3): 650 mg via ORAL
  Filled 2021-10-08 (×3): qty 2

## 2021-10-08 MED ORDER — SODIUM CHLORIDE 0.9 % IV SOLN
1.0000 g | INTRAVENOUS | Status: DC
  Administered 2021-10-08 – 2021-10-10 (×3): 1 g via INTRAVENOUS
  Filled 2021-10-08 (×3): qty 10

## 2021-10-08 MED ORDER — TRIHEXYPHENIDYL HCL 2 MG PO TABS
2.0000 mg | ORAL_TABLET | Freq: Two times a day (BID) | ORAL | Status: DC
  Administered 2021-10-08 – 2021-10-13 (×9): 2 mg via ORAL
  Filled 2021-10-08 (×14): qty 1

## 2021-10-08 MED ORDER — LACTATED RINGERS IV SOLN: INTRAVENOUS | Status: AC

## 2021-10-08 MED ORDER — OXYCODONE-ACETAMINOPHEN 5-325 MG PO TABS
1.0000 | ORAL_TABLET | ORAL | Status: DC | PRN
  Administered 2021-10-08 – 2021-10-12 (×5): 1 via ORAL
  Filled 2021-10-08 (×6): qty 1

## 2021-10-08 MED ORDER — POTASSIUM CHLORIDE CRYS ER 20 MEQ PO TBCR
40.0000 meq | EXTENDED_RELEASE_TABLET | Freq: Once | ORAL | Status: AC
  Administered 2021-10-08: 40 meq via ORAL
  Filled 2021-10-08: qty 2

## 2021-10-08 NOTE — Plan of Care (Signed)
Patient admitted from the emergency department.   Paged attending MD about diet order.    Will continue to monitor patient/offer education.    S[place], Charity fundraiser

## 2021-10-08 NOTE — Assessment & Plan Note (Addendum)
-   Continue home Sinemet 

## 2021-10-08 NOTE — Consult Note (Addendum)
Reason for Consult: Right hip pain/swelling Referring Physician: Dr. Inda Merlin   HPI: Patient is a 60 yo female, with PMH significant for parkinson's, previous TIA, COPD, that presented to [facility] ED on 10/07/21 with c/o of R hip pain and swelling.  She informs me that her symptoms began 2 days ago a few hours after having sexual intercourse for the first time in 8 years.  She denies any specific mechanism of injury.  Reports that her pain and swelling in her hip gradually worsened to where any attempt at [facility] and ROM exacerbated her symptom.  NWB and immobilization alleviates her symptoms.  She describes a sharp, throbbing sensation.  She denies any previous trauma or injury to her R hip.  She denies any prior surgical interventions to her R hip.  Upon arrival at the ED patient was febrile with elevated inflammatory markers.  Plain films were obtained that her unremarkable.  Dr. Wylene Simmer was consulted. MRI was ordered, but patient denied MRI study.  CT guided aspirate order from IR was then placed. Patient is not a diabetic or a smoker.  Past Medical History:  Diagnosis Date   Asthma    COPD (chronic obstructive pulmonary disease) ([place])    Dry eye syndrome    Idiopathic peripheral neuropathy 11/26/2015   Onychomycosis    of finger nails   Parkinson disease ([place])    Seborrheic dermatitis 09/13/2018   Stroke ([place])    Substance abuse ([place])    crack cocaine and dependency, in remission since 2012    Past Surgical History:  Procedure Laterality Date   CESAREAN SECTION      Family History  Problem Relation Age of Onset   Heart disease Other     Social History:  reports that she has quit smoking. She has never used smokeless tobacco. She reports current alcohol use. She reports that she does not use drugs.  Allergies: No Known Allergies  Medications: I have reviewed the patient's current medications.  Results for orders placed or performed during the hospital encounter of  10/07/21 (from the past 48 hour(s))  Resp Panel by RT-PCR (Flu A&B, Covid) Nasopharyngeal Swab     Status: None   Collection Time: 10/07/21 11:23 PM   Specimen: Nasopharyngeal Swab; Nasopharyngeal(NP) swabs in vial transport medium  Result Value Ref Range   SARS Coronavirus 2 by RT PCR NEGATIVE NEGATIVE    Comment: (NOTE) SARS-CoV-2 target nucleic acids are NOT DETECTED.  The SARS-CoV-2 RNA is generally detectable in upper respiratory specimens during the acute phase of infection. The lowest concentration of SARS-CoV-2 viral copies this assay can detect is 138 copies/mL. A negative result does not preclude SARS-Cov-2 infection and should not be used as the sole basis for treatment or other patient management decisions. A negative result may occur with  improper specimen collection/handling, submission of specimen other than nasopharyngeal swab, presence of viral mutation(s) within the areas targeted by this assay, and inadequate number of viral copies(<138 copies/mL). A negative result must be combined with clinical observations, patient history, and epidemiological information. The expected result is Negative.  Fact Sheet for Patients:  EntrepreneurPulse.com.au  Fact Sheet for Healthcare Providers:  IncredibleEmployment.be  This test is no t yet approved or cleared by the [place] FDA and  has been authorized for detection and/or diagnosis of SARS-CoV-2 by FDA under an Emergency Use Authorization (EUA). This EUA will remain  in effect (meaning this test can be used) for the duration of the COVID-19 declaration under  Section 564(b)(1) of the Act, 21 U.S.C.section 360bbb-3(b)(1), unless the authorization is terminated  or revoked sooner.       Influenza A by PCR NEGATIVE NEGATIVE   Influenza B by PCR NEGATIVE NEGATIVE    Comment: (NOTE) The Xpert Xpress SARS-CoV-2/FLU/RSV plus assay is intended as an aid in the diagnosis of influenza  from Nasopharyngeal swab specimens and should not be used as a sole basis for treatment. Nasal washings and aspirates are unacceptable for Xpert Xpress SARS-CoV-2/FLU/RSV testing.  Fact Sheet for Patients: BloggerCourse.com  Fact Sheet for Healthcare Providers: SeriousBroker.it  This test is not yet approved or cleared by the [place] FDA and has been authorized for detection and/or diagnosis of SARS-CoV-2 by FDA under an Emergency Use Authorization (EUA). This EUA will remain in effect (meaning this test can be used) for the duration of the COVID-19 declaration under Section 564(b)(1) of the Act, 21 U.S.C. section 360bbb-3(b)(1), unless the authorization is terminated or revoked.  Performed at [facility], [address] [place], [place] [address]   CBC with Differential/Platelet     Status: Abnormal   Collection Time: 10/08/21 12:05 AM  Result Value Ref Range   WBC 12.1 (H) 4.0 - 10.5 K/uL   RBC 4.27 3.87 - 5.11 MIL/uL   Hemoglobin 12.5 12.0 - 15.0 g/dL   HCT 80.0 34.9 - 17.9 %   MCV 84.5 80.0 - 100.0 fL   MCH 29.3 26.0 - 34.0 pg   MCHC 34.6 30.0 - 36.0 g/dL   RDW 15.0 56.9 - 79.4 %   Platelets 227 150 - 400 K/uL   nRBC 0.0 0.0 - 0.2 %   Neutrophils Relative % 84 %   Neutro Abs 10.1 (H) 1.7 - 7.7 K/uL   Lymphocytes Relative 7 %   Lymphs Abs 0.8 0.7 - 4.0 K/uL   Monocytes Relative 9 %   Monocytes Absolute 1.0 0.1 - 1.0 K/uL   Eosinophils Relative 0 %   Eosinophils Absolute 0.0 0.0 - 0.5 K/uL   Basophils Relative 0 %   Basophils Absolute 0.0 0.0 - 0.1 K/uL   Immature Granulocytes 0 %   Abs Immature Granulocytes 0.04 0.00 - 0.07 K/uL    Comment: Performed at [facility], [address], [place], [place] [address]  Comprehensive metabolic panel     Status: Abnormal   Collection Time: 10/08/21 12:05 AM  Result Value Ref Range   Sodium 135 135 - 145 mmol/L   Potassium 3.4 (L)  3.5 - 5.1 mmol/L   Chloride 103 98 - 111 mmol/L   CO2 23 22 - 32 mmol/L   Glucose, Bld 132 (H) 70 - 99 mg/dL    Comment: Glucose reference range applies only to samples taken after fasting for at least 8 hours.   BUN 9 8 - 23 mg/dL   Creatinine, Ser 5.37 0.44 - 1.00 mg/dL   Calcium 9.3 8.9 - 48.2 mg/dL   Total Protein 7.5 6.5 - 8.1 g/dL   Albumin 3.9 3.5 - 5.0 g/dL   AST 17 15 - 41 U/L   ALT 14 0 - 44 U/L   Alkaline Phosphatase 58 38 - 126 U/L   Total Bilirubin 1.3 (H) 0.3 - 1.2 mg/dL   GFR, Estimated >70 >78 mL/min    Comment: (NOTE) Calculated using the CKD-EPI Creatinine Equation (2021)    Anion gap 9 5 - 15    Comment: Performed at [facility], [address] [place], [place]  [id number]  Lactic acid, plasma     Status: None   Collection Time: 10/08/21 12:05 AM  Result Value Ref Range   Lactic Acid, Venous 1.0 0.5 - 1.9 mmol/L    Comment: Performed at [facility], [place] [address], [place], [place] [address]  Blood culture (routine x 2)     Status: None (Preliminary result)   Collection Time: 10/08/21 12:05 AM   Specimen: BLOOD  Result Value Ref Range   Specimen Description      BLOOD RIGHT ANTECUBITAL Performed at [facility], [place] [address], [place], [place] [address]    Special Requests      BOTTLES DRAWN AEROBIC AND ANAEROBIC Blood Culture results may not be optimal due to an inadequate volume of blood received in culture bottles Performed at [facility], [place] [address] [place], [place] [address]    Culture PENDING    Report Status PENDING   Sedimentation rate     Status: Abnormal   Collection Time: 10/08/21 12:05 AM  Result Value Ref Range   Sed Rate 33 (H) 0 - 22 mm/hr    Comment: Performed at [facility], [place] [address] [place], [place] [address]  C-reactive protein     Status: Abnormal   Collection Time: 10/08/21 12:05 AM  Result Value Ref Range   CRP 8.8 (H) <1.0 mg/dL     Comment: Performed at [place] [address], [place], [place] [address]  Urinalysis, Routine w reflex microscopic Urine, Clean Catch     Status: Abnormal   Collection Time: 10/08/21  3:00 AM  Result Value Ref Range   Color, Urine YELLOW YELLOW   APPearance CLEAR CLEAR   Specific Gravity, Urine 1.009 1.005 - 1.030   pH 7.0 5.0 - 8.0   Glucose, UA NEGATIVE NEGATIVE mg/dL   Hgb urine dipstick NEGATIVE NEGATIVE   Bilirubin Urine NEGATIVE NEGATIVE   Ketones, ur NEGATIVE NEGATIVE mg/dL   Protein, ur NEGATIVE NEGATIVE mg/dL   Nitrite NEGATIVE NEGATIVE   Leukocytes,Ua TRACE (A) NEGATIVE   RBC / HPF 0-5 0 - 5 RBC/hpf   WBC, UA 0-5 0 - 5 WBC/hpf   Bacteria, UA RARE (A) NONE SEEN   Squamous Epithelial / LPF 0-5 0 - 5    Comment: Performed at [facility], [place] [address], [place], [place] [address]  Lactic acid, plasma     Status: None   Collection Time: 10/08/21  6:30 AM  Result Value Ref Range   Lactic Acid, Venous 0.8 0.5 - 1.9 mmol/L    Comment: Performed at [facility], [place] [address] [place], [place] [address]  HIV Antibody (routine testing w rflx)     Status: None   Collection Time: 10/08/21  6:30 AM  Result Value Ref Range   HIV Screen 4th Generation wRfx Non Reactive Non Reactive    Comment: Performed at [facility], [place] [address], [place], [place] [address]  CBC WITH DIFFERENTIAL     Status: Abnormal   Collection Time: 10/08/21  6:30 AM  Result Value Ref Range   WBC 12.7 (H) 4.0 - 10.5 K/uL   RBC 4.07 3.87 - 5.11 MIL/uL   Hemoglobin 11.8 (L) 12.0 - 15.0 g/dL   HCT 35.6 (L) 36.0 - 46.0 %   MCV 87.5 80.0 - 100.0 fL   MCH 29.0 26.0 - 34.0 pg   MCHC 33.1 30.0 - 36.0 g/dL   RDW  13.2 11.5 - 15.5 %   Platelets 229 150 - 400 K/uL   nRBC 0.0 0.0 - 0.2 %   Neutrophils Relative % 73 %   Neutro Abs 9.2 (H) 1.7 - 7.7 K/uL   Lymphocytes Relative 14 %   Lymphs Abs 1.8 0.7 - 4.0 K/uL   Monocytes Relative 13 %   Monocytes  Absolute 1.6 (H) 0.1 - 1.0 K/uL   Eosinophils Relative 0 %   Eosinophils Absolute 0.0 0.0 - 0.5 K/uL   Basophils Relative 0 %   Basophils Absolute 0.0 0.0 - 0.1 K/uL   Immature Granulocytes 0 %   Abs Immature Granulocytes 0.05 0.00 - 0.07 K/uL    Comment: Performed at [facility], [address] [place], [place] [address]  APTT     Status: None   Collection Time: 10/08/21  6:30 AM  Result Value Ref Range   aPTT 34 24 - 36 seconds    Comment: Performed at [facility], [address] [place], [place] [address]  Protime-INR     Status: Abnormal   Collection Time: 10/08/21  6:30 AM  Result Value Ref Range   Prothrombin Time 15.8 (H) 11.4 - 15.2 seconds   INR 1.3 (H) 0.8 - 1.2    Comment: (NOTE) INR goal varies based on device and disease states. Performed at [facility], [address] [place], [place] [address]     DG Chest 1 View  Result Date: 10/08/2021 CLINICAL DATA:  Pneumonia EXAM: CHEST  1 VIEW COMPARISON:  None. FINDINGS: Generous heart size accentuated by technique. Negative aortic and hilar contours. No acute infiltrate or edema. No effusion or pneumothorax. No acute osseous findings. IMPRESSION: No active disease. Electronically Signed   By: Tiburcio PeaJonathan  Watts M.D.   On: 10/08/2021 06:48   CT Lumbar Spine Wo Contrast  Result Date: 10/08/2021 CLINICAL DATA:  Initial evaluation for acute low back pain, infection suspected. EXAM: CT LUMBAR SPINE WITHOUT CONTRAST TECHNIQUE: Multidetector CT imaging of the lumbar spine was performed without intravenous contrast administration. Multiplanar CT image reconstructions were also generated. COMPARISON:  None available. FINDINGS: Segmentation: Standard. Lowest well-formed disc space labeled the L5-S1 level. Alignment: Mild levoscoliosis. Alignment otherwise normal preservation of the normal lumbar lordosis. No listhesis. Vertebrae: Vertebral body height maintained without acute or  chronic fracture. Visualized sacrum and pelvis intact. SI joints symmetric and normal. No discrete or worrisome osseous lesions. No CT evidence for osteomyelitis discitis or septic arthritis. Paraspinal and other soft tissues: Unremarkable. No visible inflammatory changes. Mild aorto bi-iliac atherosclerotic disease. Disc levels: L1-2: Negative interspace. Mild to moderate facet hypertrophy. No significant spinal stenosis. Mild bilateral foraminal narrowing, greater on the left. L2-3: Negative interspace. Moderate bilateral facet hypertrophy. No significant spinal stenosis. Mild bilateral foraminal narrowing, worse on the left. L3-4: Mild disc bulge. Superimposed right foraminal disc protrusion (series 4, image 104), potentially affecting the exiting right L3 nerve root. Mild to moderate facet hypertrophy. No significant spinal stenosis. Foramina remain patent. L4-5: Mild annular disc bulge. Mild to moderate facet hypertrophy. No significant spinal stenosis. Foramina remain patent. L5-S1: Mild disc bulge. Moderate bilateral facet hypertrophy. Mild narrowing of the lateral recesses bilaterally. Central canal remains widely patent. No significant foraminal encroachment. IMPRESSION: 1. No CT evidence for acute abnormality within the lumbar spine. Specifically, no findings to suggest acute infection identified. 2. Right foraminal disc protrusion at L3-4, potentially affecting the exiting right L3 nerve root. 3. Mild disc bulging with moderate facet hypertrophy  at L2-3 through L5-S1 without significant stenosis. Electronically Signed   By: Jeannine Boga M.D.   On: 10/08/2021 03:52   CT HIP RIGHT W CONTRAST  Result Date: 10/08/2021 CLINICAL DATA:  Right hip pain, concern for septic arthritis EXAM: CT OF THE LOWER RIGHT EXTREMITY WITH CONTRAST TECHNIQUE: Multidetector CT imaging of the lower right extremity was performed according to the standard protocol following intravenous contrast administration. CONTRAST:   76mL OMNIPAQUE IOHEXOL 350 MG/ML SOLN COMPARISON:  None. FINDINGS: No fracture or dislocation is seen. Right hip joint space is preserved. Trace right hip joint fluid collection (series 4/images 36 and 43). Visualized soft tissues are otherwise unremarkable. IMPRESSION: Trace right hip effusion. No acute osseous abnormality is seen. Electronically Signed   By: Julian Hy M.D.   On: 10/08/2021 03:18   DG Hip Unilat W or Wo Pelvis 1 View Right  Result Date: 10/07/2021 CLINICAL DATA:  Right hip pain. EXAM: DG HIP (WITH OR WITHOUT PELVIS) 1V RIGHT COMPARISON:  None. FINDINGS: There is no acute fracture or dislocation. Mild bilateral hip arthritic changes. The soft tissues are unremarkable. IMPRESSION: No acute fracture or dislocation. Electronically Signed   By: Anner Crete M.D.   On: 10/07/2021 21:00    ROS:   Gen: Denies fever, chills, weight change, fatigue, night sweats MSK: C/o R hip pain and swelling with ROM and WB. Neuro: Denies headache, numbness, weakness, slurred speech, loss of memory or consciousness Derm: Denies rash, dry skin, scaling or peeling skin change HEENT: Denies blurred vision, double vision, neck stiffness, dysphagia PULM: Denies shortness of breath, cough, sputum production, hemoptysis, wheezing CV: Denies chest pain, edema, orthopnea, palpitations GI: Denies abdominal pain, nausea, vomiting, diarrhea, hematochezia, melena, constipation  Endocrine: Denies hot or cold intolerance, polyuria, polyphagia or appetite change Heme: Denies easy bruising, bleeding, bleeding gums  PE:  Blood pressure 119/70, pulse 71, temperature 98.4 F (36.9 C), temperature source Oral, resp. rate 16, height 5\' 9"  (1.753 m), weight 81.6 kg, SpO2 99 %. General: WDWN patient in NAD. Psych:  Appropriate mood and affect. Neuro:  A&O x 3, Moving all extremities, sensation intact to light touch HEENT:  EOMs intact Chest:  Even non-labored respirations Skin:  InC/D/I, no rashes or  lesions Extremities: warm/dry, Moderate edema to R hip, no erythema or echymosis.  No lymphadenopathy. Pulses: Popliteus 2+ MSK:  Tender to palpation over ant hip joint. ROM: TKE, pain with attempted R hip ROM, MMT: able to perform quad set.  Pain with log roll  Assessment/Plan: R hip pain/effusion  Awaiting CT guided R hip aspirate from IR Continue to hold ABX  WBAT R LE Dr. Doran Durand and Dr. Lyla Glassing are following.  Mechele Claude PA-C [facility] Office:  [phone]   Addendum:  agree with note above.  Hip aspirate was purulent.  Cell count couldn't be performed.  No crystals.  Cultures pending.  IV Vanc and ceftriaxone started.  NPO after midnight.

## 2021-10-08 NOTE — Assessment & Plan Note (Addendum)
-   Patient endorsed recent sexual intercourse prior to onset of right hip pain.  She endorsed intercourse was unprotected and was concerned for possible infection contributing to her pain - GC/CHL is negative (has resulted on 11/10) - HIV negative (though technically would need repeat in ~1 month if high concern as testing would not have had time for seroconversion)

## 2021-10-08 NOTE — ED Notes (Signed)
Obtained Blood culture x1.  Pt refuses second blood draw for second blood culture

## 2021-10-08 NOTE — ED Notes (Signed)
Pt. Refused MRI, stating the "way her body feels right now." RN, Candise Bowens made aware and Rancour, MD.

## 2021-10-08 NOTE — Assessment & Plan Note (Addendum)
Repleted. °

## 2021-10-08 NOTE — Hospital Course (Signed)
Sarah Mcconnell is a 60 yo female with PMH Parkinson's disease, TIA, COPD, mild intermittent asthma, peripheral neuropathy who presented to the hospital with right hip pain.  She endorsed pain started approximately 1 to 2 days prior to admission and is felt worse in her right hip but some down towards her right lower thigh.  She has had difficulty walking due to this. In addition on admission, she admitted to having unprotected sexual intercourse for the first time in several years.  She expressed concern for possible infection related to this. She was found to have low-grade fever on work-up, 100.8.  WBC elevated, 12.1 initially.  CT right hip showed small right effusion.  She was recommended for MRI to better evaluate however declined.  Therefore, she was recommended to undergo joint aspiration with IR. She also underwent CT L-spine which showed mild disc bulge at L3-4 with no significant spinal stenosis.

## 2021-10-08 NOTE — ED Notes (Signed)
Attending at bedside.

## 2021-10-08 NOTE — Progress Notes (Signed)
Progress Note    Sarah Mcconnell   DXI:[id number]  DOB: 1961-10-23  DOA: 10/07/2021     0 PCP: [facility]  Initial CC: right hip pain  Hospital Course: [name] is a 60 yo female with PMH Parkinson's disease, TIA, COPD, mild intermittent asthma, peripheral neuropathy who presented to the hospital with right hip pain.  She endorsed pain started approximately 1 to 2 days prior to admission and is felt worse in her right hip but some down towards her right lower thigh.  She has had difficulty walking due to this. In addition on admission, she admitted to having unprotected sexual intercourse for the first time in several years.  She expressed concern for possible infection related to this. She was found to have low-grade fever on work-up, 100.8.  WBC elevated, 12.1 initially.  CT right hip showed small right effusion.  She was recommended for MRI to better evaluate however declined.  Therefore, she was recommended to undergo joint aspiration with IR. She also underwent CT L-spine which showed mild disc bulge at L3-4 with no significant spinal stenosis.  Interval History:  Resting in bed this morning still complaining of ongoing right hip pain.  She again expressed concern regarding her recent sexual activity. She is amenable for undergoing aspiration of the joint fluid for further testing.  Assessment & Plan: * Acute right hip pain - Patient presenting with 2-day history of progressively worsening right hip pain; on arrival, patient found to be febrile with elevated inflammatory markers and leukocytosis -Differential includes septic arthritis versus inflammation due to underlying arthritic changes -CT right hip shows small effusion, patient declining MRI.  Follow-up joint aspiration from IR. - Continue holding antibiotics and follow-up aspiration cultures  SIRS (systemic inflammatory response syndrome) (HCC) - See above  History of sexual  intercourse - Patient endorsed recent sexual intercourse prior to onset of right hip pain.  She endorsed intercourse was unprotected and was concerned for possible infection contributing to her pain - Follow-up GC/CHL - HIV negative (though technically would need repeat in ~1 month if high concern as testing would not have had time for seroconversion) -Follow-up aspiration cultures  Hypokalemia - Replete and recheck as needed   Parkinson disease (HCC) - Continue home Sinemet  COPD with asthma (HCC) - No signs or symptoms of exacerbation - Continue breathing treatments     Old records reviewed in assessment of this patient  Antimicrobials: None  DVT prophylaxis: Can start Lovenox after aspiration   Code Status:   Code Status: Full Code  Disposition Plan: Status is: Observation  The patient will require care spanning > 2 midnights and should be moved to inpatient because: joint aspiration and IV abx awaiting some culture results   Risk of unplanned readmission score:     Objective: Blood pressure 128/78, pulse 71, temperature 98.4 F (36.9 C), temperature source Oral, resp. rate 16, height 5\' 9"  (1.753 m), weight 81.6 kg, SpO2 100 %.  Examination: Physical Exam Constitutional:      General: She is not in acute distress.    Appearance: Normal appearance.     Comments: Uncomfortable appearing  HENT:     Mouth/Throat:     Mouth: Mucous membranes are moist.  Eyes:     Extraocular Movements: Extraocular movements intact.  Cardiovascular:     Rate and Rhythm: Normal rate and regular rhythm.  Pulmonary:     Effort: Pulmonary effort is normal.     Breath sounds: Normal  breath sounds.  Abdominal:     General: Bowel sounds are normal. There is no distension.     Palpations: Abdomen is soft.     Tenderness: There is no abdominal tenderness.  Musculoskeletal:     Cervical back: Normal range of motion and neck supple.     Comments: Pain with passive range of motion in  the right hip.  Decreased active range of motion in the right hip due to pain  Skin:    General: Skin is warm and dry.  Neurological:     Mental Status: She is alert.     Comments: No true focal deficit, strength in right lower extremity limited by pain in hip. Upper extremity resting tremor appreciated  Psychiatric:        Mood and Affect: Mood normal.        Behavior: Behavior normal.     Consultants:  Ortho IR  Procedures:  R hip joint aspiration, 10/08/21  Data Reviewed: I have personally reviewed labs and imaging studies No new findings    LOS: 0 days  Time spent: Greater than 50% of the 35 minute visit was spent in counseling/coordination of care for the patient as laid out in the A&P.   Lewie Chamber, MD [facility] 10/08/2021, 3:09 PM

## 2021-10-08 NOTE — ED Notes (Signed)
Pt requested to sit up in bed and attempt to stand at bedside. Pt able to sit up with minimal pain. Two assist to adjust in the bed and with getting feet to floor. Minimal assist to stand. Initially, pt felt like feet 'stuck to the floor', but was able to begin taking steps with a hand on the table. Then able to ambulate in small steps 2-3 feet away from and back to bed. Repositioned back in bed. Pt stated she felt like once she got going she was okay but getting started was difficult. Expressed hope that IR aspiration would help with movement.

## 2021-10-08 NOTE — Assessment & Plan Note (Addendum)
-   leukocytosis, fever, source considered septic right hip  - See above

## 2021-10-08 NOTE — Assessment & Plan Note (Addendum)
-   Patient presenting with 2-day history of progressively worsening right hip pain; on arrival, patient found to be febrile with elevated inflammatory markers and leukocytosis -Differential includes septic arthritis versus inflammation due to underlying arthritic changes -CT right hip shows small effusion, patient declining MRI.  Follow-up joint aspiration from IR: no crystals, but due to clot unable to quantify WBC count, but 82% neutrophils noted. Concern is septic arthritis at this time - patient started on vanc and rocephin per ortho on 11/8 - GC/CHL is negative -Appreciate ID consult.  Plan is for continuing vancomycin and Rocephin until 10/14/2021 then transitioning to doxycycline 100 mg twice daily and cefadroxil 500 mg twice daily for 4 weeks until 11/04/2021 with outpatient ID follow-up - patient did not wish to remain in hospital until 11/14, so she was discharged on 11/13 after abx doses and continued on above oral abx regimen; pain and strength/ROM were still improved and stable

## 2021-10-08 NOTE — Progress Notes (Signed)
R hip aspiration performed by radiology. 5 cc purulent fluid aspirated. Sent for cell count and gram stain / culture. No data is back yet. Will start IV vanco and ceftriaxone for now. Following.

## 2021-10-08 NOTE — Progress Notes (Signed)
Pharmacy Antibiotic Note  Sarah Mcconnell is a 60 y.o. female admitted on 10/07/2021 with septic arthritis.  Pharmacy has been consulted for vancomycin dosing.  Plan: Vancomycin 1g IV q12h for estimated AUC 497 using SCr 0.8, Vd 0.72 Check vancomycin levels at steady state, goal AUC 400-550 CTX per MD Follow up renal function & cultures  Height: 5\' 9"  (175.3 cm) Weight: 81.6 kg (180 lb) IBW/kg (Calculated) : 66.2  Temp (24hrs), Avg:99 F (37.2 C), Min:98 F (36.7 C), Max:100.8 F (38.2 C)  Recent Labs  Lab 10/08/21 0005 10/08/21 0630  WBC 12.1* 12.7*  CREATININE 0.72  --   LATICACIDVEN 1.0 0.8    Estimated Creatinine Clearance: 84.4 mL/min (by C-G formula based on SCr of 0.72 mg/dL).    No Known Allergies  Antimicrobials this admission: 11/8 CTX >> 11/8 Vancomycin >>  Dose adjustments this admission:   Microbiology results: 11/8 BCx:  Thank you for allowing pharmacy to be a part of this patient's care.  13/8, PharmD, BCPS Pharmacy: [phone] 10/08/2021 5:35 PM

## 2021-10-08 NOTE — H&P (Signed)
History and Physical    Sarah Mcconnell ZDG:[id number] DOB: 01-02-1961 DOA: 10/07/2021  PCP: [facility] [place]  Patient coming from: Home   Chief Complaint:  Chief Complaint  Patient presents with   Hip Pain     HPI:    60 year old female with past medical history of Parkinson's disease, mild intermittent asthma, peripheral neuropathy, previous TIA and remote history of polysubstance abuse in remission since 2012 who presented to [facility] emergency department with complaints of right hip pain.  Patient states that yesterday she began to feel right lower extremity and hip weakness.  In the following 24 hours patient began to develop associated right hip pain, severe in intensity, radiating distally, worse with weightbearing or any movement of the affected extremity.  Patient denies sick contacts, recent travel, fever, nausea, vomiting, dysuria or abdominal pain.  Upon further questioning, patient states that she had sexual intercourse for the first time in 9 years 24 hours prior to the onset of her symptoms.  Patient's pain worsened to the point that she presented to [facility] emergency department for evaluation.  Upon evaluation in the emergency department patient was found to exhibit multiple SIRS criteria including a fever of 120 F and leukocytosis of 12.1.  CRP was found to be elevated at 8.8 with elevated ESR of 33.  50 mcg of fentanyl were administered for pain.  ER provider was concerned for possible septic arthritis of the hip and therefore contacted Dr. Doran Durand with orthopedics.  He recommended hospitalist admission, obtaining MRI of the right hip and if MRI is abnormal IR consultation for CT-guided aspiration.  The hospitalist group was then called to assess the patient for admission to the hospital.  Review of Systems:   Review of Systems  Musculoskeletal:  Positive for joint pain.  Neurological:  Positive for weakness.  All  other systems reviewed and are negative.  Past Medical History:  Diagnosis Date   Asthma    COPD (chronic obstructive pulmonary disease) ([place])    Dry eye syndrome    Idiopathic peripheral neuropathy 11/26/2015   Onychomycosis    of finger nails   Parkinson disease ([place])    Seborrheic dermatitis 09/13/2018   Stroke ([place])    Substance abuse ([place])    crack cocaine and dependency, in remission since 2012    Past Surgical History:  Procedure Laterality Date   CESAREAN SECTION       reports that she has quit smoking. She has never used smokeless tobacco. She reports current alcohol use. She reports that she does not use drugs.  No Known Allergies  Family History  Problem Relation Age of Onset   Heart disease Other      Prior to Admission medications   Medication Sig Start Date End Date Taking? Authorizing Provider  acetaminophen (TYLENOL) 500 MG tablet Take 500 mg by mouth every 6 (six) hours as needed for mild pain or moderate pain.   Yes [provider]  albuterol (VENTOLIN HFA) 108 (90 Base) MCG/ACT inhaler Inhale 1 puff into the lungs every 6 (six) hours as needed for shortness of breath. 05/12/19  Yes [provider]  carbidopa-levodopa (SINEMET IR) 25-100 MG tablet Take 1 tablet by mouth in the morning and at bedtime. 03/08/20  Yes [provider]  trihexyphenidyl (ARTANE) 2 MG tablet Take 2 mg by mouth 2 (two) times daily with a meal. 03/08/20  Yes [provider]  oxyCODONE-acetaminophen (PERCOCET/ROXICET) 5-325 MG tablet Take 1  tablet by mouth every 6 (six) hours as needed for severe pain. Patient not taking: Reported on 10/08/2021 05/02/21   Blanchie Dessert, MD  predniSONE (DELTASONE) 10 MG tablet Take 2 tablets (20 mg total) by mouth daily. Patient not taking: Reported on 10/08/2021 06/05/21   Quintella Reichert, MD    Physical Exam: Vitals:   10/07/21 2255 10/07/21 2331 10/08/21 0623 10/08/21 0705  BP: (!) 143/92 136/82  122/68  Pulse: 84  84  65  Resp: '14 16  18  ' Temp: 98 F (36.7 C) (!) 100.8 F (38.2 C) 98.4 F (36.9 C) 98.4 F (36.9 C)  TempSrc: Oral Rectal Oral Oral  SpO2: 98% 98%  99%  Weight:      Height:        Constitutional: Awake alert and oriented x3, patient is in distress due to pain.   Skin: no rashes, no lesions, poor skin turgor noted. Eyes: Pupils are equally reactive to light.  No evidence of scleral icterus or conjunctival pallor.  ENMT: Moist mucous membranes noted.  Posterior pharynx clear of any exudate or lesions.   Neck: normal, supple, no masses, no thyromegaly.  No evidence of jugular venous distension.   Respiratory: clear to auscultation bilaterally, no wheezing, no crackles. Normal respiratory effort. No accessory muscle use.  Cardiovascular: Regular rate and rhythm, no murmurs / rubs / gallops. No extremity edema. 2+ pedal pulses. No carotid bruits.  Chest:   Nontender without crepitus or deformity.   Back:   Nontender without crepitus or deformity. Abdomen: Abdomen is soft and nontender.  No evidence of intra-abdominal masses.  Positive bowel sounds noted in all quadrants.   Musculoskeletal: Severe pain with both passive and active range of motion of the right hip without crepitus.  Limited range of motion due to pain.  Normal muscle tone.  Neurologic: Notable significant tremor at rest.  CN 2-12 grossly intact. Sensation intact.  Patient moving all 4 extremities spontaneously.  Patient is following all commands.  Patient is responsive to verbal stimuli.   Psychiatric: Patient exhibits normal mood with appropriate affect.  Patient seems to possess insight as to their current situation.     Labs on Admission: I have personally reviewed following labs and imaging studies -   CBC: Recent Labs  Lab 10/08/21 0005 10/08/21 0630  WBC 12.1* 12.7*  NEUTROABS 10.1* 9.2*  HGB 12.5 11.8*  HCT 36.1 35.6*  MCV 84.5 87.5  PLT 227 034   Basic Metabolic Panel: Recent Labs  Lab 10/08/21 0005   NA 135  K 3.4*  CL 103  CO2 23  GLUCOSE 132*  BUN 9  CREATININE 0.72  CALCIUM 9.3   GFR: Estimated Creatinine Clearance: 84.4 mL/min (by C-G formula based on SCr of 0.72 mg/dL). Liver Function Tests: Recent Labs  Lab 10/08/21 0005  AST 17  ALT 14  ALKPHOS 58  BILITOT 1.3*  PROT 7.5  ALBUMIN 3.9   No results for input(s): LIPASE, AMYLASE in the last 168 hours. No results for input(s): AMMONIA in the last 168 hours. Coagulation Profile: Recent Labs  Lab 10/08/21 0630  INR 1.3*   Cardiac Enzymes: No results for input(s): CKTOTAL, CKMB, CKMBINDEX, TROPONINI in the last 168 hours. BNP (last 3 results) No results for input(s): PROBNP in the last 8760 hours. HbA1C: No results for input(s): HGBA1C in the last 72 hours. CBG: No results for input(s): GLUCAP in the last 168 hours. Lipid Profile: No results for input(s): CHOL, HDL, LDLCALC, TRIG, CHOLHDL, LDLDIRECT in  the last 72 hours. Thyroid Function Tests: No results for input(s): TSH, T4TOTAL, FREET4, T3FREE, THYROIDAB in the last 72 hours. Anemia Panel: No results for input(s): VITAMINB12, FOLATE, FERRITIN, TIBC, IRON, RETICCTPCT in the last 72 hours. Urine analysis:    Component Value Date/Time   COLORURINE YELLOW 10/08/2021 0300   APPEARANCEUR CLEAR 10/08/2021 0300   LABSPEC 1.009 10/08/2021 0300   PHURINE 7.0 10/08/2021 0300   GLUCOSEU NEGATIVE 10/08/2021 0300   HGBUR NEGATIVE 10/08/2021 0300   BILIRUBINUR NEGATIVE 10/08/2021 0300   KETONESUR NEGATIVE 10/08/2021 0300   PROTEINUR NEGATIVE 10/08/2021 0300   NITRITE NEGATIVE 10/08/2021 0300   LEUKOCYTESUR TRACE (A) 10/08/2021 0300    Radiological Exams on Admission - Personally Reviewed: DG Chest 1 View  Result Date: 10/08/2021 CLINICAL DATA:  Pneumonia EXAM: CHEST  1 VIEW COMPARISON:  None. FINDINGS: Generous heart size accentuated by technique. Negative aortic and hilar contours. No acute infiltrate or edema. No effusion or pneumothorax. No acute osseous  findings. IMPRESSION: No active disease. Electronically Signed   By: Jorje Guild M.D.   On: 10/08/2021 06:48   CT Lumbar Spine Wo Contrast  Result Date: 10/08/2021 CLINICAL DATA:  Initial evaluation for acute low back pain, infection suspected. EXAM: CT LUMBAR SPINE WITHOUT CONTRAST TECHNIQUE: Multidetector CT imaging of the lumbar spine was performed without intravenous contrast administration. Multiplanar CT image reconstructions were also generated. COMPARISON:  None available. FINDINGS: Segmentation: Standard. Lowest well-formed disc space labeled the L5-S1 level. Alignment: Mild levoscoliosis. Alignment otherwise normal preservation of the normal lumbar lordosis. No listhesis. Vertebrae: Vertebral body height maintained without acute or chronic fracture. Visualized sacrum and pelvis intact. SI joints symmetric and normal. No discrete or worrisome osseous lesions. No CT evidence for osteomyelitis discitis or septic arthritis. Paraspinal and other soft tissues: Unremarkable. No visible inflammatory changes. Mild aorto bi-iliac atherosclerotic disease. Disc levels: L1-2: Negative interspace. Mild to moderate facet hypertrophy. No significant spinal stenosis. Mild bilateral foraminal narrowing, greater on the left. L2-3: Negative interspace. Moderate bilateral facet hypertrophy. No significant spinal stenosis. Mild bilateral foraminal narrowing, worse on the left. L3-4: Mild disc bulge. Superimposed right foraminal disc protrusion (series 4, image 104), potentially affecting the exiting right L3 nerve root. Mild to moderate facet hypertrophy. No significant spinal stenosis. Foramina remain patent. L4-5: Mild annular disc bulge. Mild to moderate facet hypertrophy. No significant spinal stenosis. Foramina remain patent. L5-S1: Mild disc bulge. Moderate bilateral facet hypertrophy. Mild narrowing of the lateral recesses bilaterally. Central canal remains widely patent. No significant foraminal encroachment.  IMPRESSION: 1. No CT evidence for acute abnormality within the lumbar spine. Specifically, no findings to suggest acute infection identified. 2. Right foraminal disc protrusion at L3-4, potentially affecting the exiting right L3 nerve root. 3. Mild disc bulging with moderate facet hypertrophy at L2-3 through L5-S1 without significant stenosis. Electronically Signed   By: Jeannine Boga M.D.   On: 10/08/2021 03:52   CT HIP RIGHT W CONTRAST  Result Date: 10/08/2021 CLINICAL DATA:  Right hip pain, concern for septic arthritis EXAM: CT OF THE LOWER RIGHT EXTREMITY WITH CONTRAST TECHNIQUE: Multidetector CT imaging of the lower right extremity was performed according to the standard protocol following intravenous contrast administration. CONTRAST:  2m OMNIPAQUE IOHEXOL 350 MG/ML SOLN COMPARISON:  None. FINDINGS: No fracture or dislocation is seen. Right hip joint space is preserved. Trace right hip joint fluid collection (series 4/images 36 and 43). Visualized soft tissues are otherwise unremarkable. IMPRESSION: Trace right hip effusion. No acute osseous abnormality is seen. Electronically Signed  By: Julian Hy M.D.   On: 10/08/2021 03:18   DG Hip Unilat W or Wo Pelvis 1 View Right  Result Date: 10/07/2021 CLINICAL DATA:  Right hip pain. EXAM: DG HIP (WITH OR WITHOUT PELVIS) 1V RIGHT COMPARISON:  None. FINDINGS: There is no acute fracture or dislocation. Mild bilateral hip arthritic changes. The soft tissues are unremarkable. IMPRESSION: No acute fracture or dislocation. Electronically Signed   By: Anner Crete M.D.   On: 10/07/2021 21:00    EKG: Personally reviewed.  Rhythm is normal sinus rhythm with heart rate of 73 bpm.  No dynamic ST segment changes appreciated.  Assessment/Plan  * Acute right hip pain Patient presenting with 2-day history of progressively worsening right hip pain On arrival, patient found to be febrile with elevated inflammatory markers and  leukocytosis Significant pain of the right hip when the joint is isolated on exam bringing about concern for possible infectious process such as septic arthritis ER providers discussed case with Dr. Doran Durand with orthopedic surgery who recommends MRI of the hip followed by IR consultation for joint aspiration Of note, patient reports new sexual partner within 24 hours of onset of symptoms.  This could suggest that the hip injury is either traumatic or gonococcal in origin.  I am additionally sending a urine sample for GC chlamydia testing.   Orthopedic surgery also recommending holding off on antibiotics until work-up is complete Hydrating patient with intravenous isotonic fluids in the meantime Blood cultures obtained As needed opiate-based analgesics for associated pain  SIRS (systemic inflammatory response syndrome) (HCC) Please see assessment and plan above.  Parkinson disease ([place]) Continue home regimen of Sinemet  COPD with asthma ([place]) No evidence of exacerbation this time As needed bronchodilator therapy for episodic shortness of breath and wheezing.   Hypokalemia Replacing with potassium chloride Evaluating for concurrent hypomagnesemia  Monitoring potassium levels with serial chemistries.       Code Status:  Full code  code status decision has been confirmed with: patient Family Communication: deferred   Status is: Observation  The patient remains OBS appropriate and will d/c before 2 midnights.       Vernelle Emerald MD [facility] Pager [phone]  If 7PM-7AM, please contact night-coverage [email or web address] Use universal [place] password for that web site. If you do not have the password, please call the hospital operator.  10/08/2021, 9:08 AM

## 2021-10-08 NOTE — ED Notes (Signed)
Pt is on purewick, will send urine sample once she is able to void, pt voided directly before being placed on purewick

## 2021-10-08 NOTE — Assessment & Plan Note (Addendum)
-   No signs or symptoms of exacerbation - Continue breathing treatments

## 2021-10-08 NOTE — ED Notes (Signed)
Bladder scan showed in bladder, EDP notified.  Pt voided last a bit over an hour ago.

## 2021-10-08 NOTE — ED Notes (Signed)
Pt refused to go to MRI.

## 2021-10-09 DIAGNOSIS — E876 Hypokalemia: Secondary | ICD-10-CM | POA: Diagnosis present

## 2021-10-09 DIAGNOSIS — Z20822 Contact with and (suspected) exposure to covid-19: Secondary | ICD-10-CM | POA: Diagnosis present

## 2021-10-09 DIAGNOSIS — Z9189 Other specified personal risk factors, not elsewhere classified: Secondary | ICD-10-CM | POA: Diagnosis not present

## 2021-10-09 DIAGNOSIS — A419 Sepsis, unspecified organism: Secondary | ICD-10-CM | POA: Diagnosis present

## 2021-10-09 DIAGNOSIS — Z8249 Family history of ischemic heart disease and other diseases of the circulatory system: Secondary | ICD-10-CM | POA: Diagnosis not present

## 2021-10-09 DIAGNOSIS — G609 Hereditary and idiopathic neuropathy, unspecified: Secondary | ICD-10-CM | POA: Diagnosis present

## 2021-10-09 DIAGNOSIS — Z79899 Other long term (current) drug therapy: Secondary | ICD-10-CM | POA: Diagnosis not present

## 2021-10-09 DIAGNOSIS — G20 Parkinson's disease: Secondary | ICD-10-CM | POA: Diagnosis present

## 2021-10-09 DIAGNOSIS — M009 Pyogenic arthritis, unspecified: Secondary | ICD-10-CM | POA: Diagnosis present

## 2021-10-09 DIAGNOSIS — M25551 Pain in right hip: Secondary | ICD-10-CM | POA: Diagnosis present

## 2021-10-09 DIAGNOSIS — J4520 Mild intermittent asthma, uncomplicated: Secondary | ICD-10-CM | POA: Diagnosis present

## 2021-10-09 DIAGNOSIS — Z8673 Personal history of transient ischemic attack (TIA), and cerebral infarction without residual deficits: Secondary | ICD-10-CM | POA: Diagnosis not present

## 2021-10-09 DIAGNOSIS — Z87891 Personal history of nicotine dependence: Secondary | ICD-10-CM | POA: Diagnosis not present

## 2021-10-09 DIAGNOSIS — J449 Chronic obstructive pulmonary disease, unspecified: Secondary | ICD-10-CM | POA: Diagnosis present

## 2021-10-09 DIAGNOSIS — J189 Pneumonia, unspecified organism: Secondary | ICD-10-CM | POA: Diagnosis present

## 2021-10-09 DIAGNOSIS — R6510 Systemic inflammatory response syndrome (SIRS) of non-infectious origin without acute organ dysfunction: Secondary | ICD-10-CM | POA: Diagnosis not present

## 2021-10-09 DIAGNOSIS — R262 Difficulty in walking, not elsewhere classified: Secondary | ICD-10-CM | POA: Diagnosis present

## 2021-10-09 LAB — CBC WITH DIFFERENTIAL/PLATELET
Abs Immature Granulocytes: 0.04 10*3/uL (ref 0.00–0.07)
Basophils Absolute: 0 10*3/uL (ref 0.0–0.1)
Basophils Relative: 0 %
Eosinophils Absolute: 0.1 10*3/uL (ref 0.0–0.5)
Eosinophils Relative: 1 %
HCT: 35.7 % — ABNORMAL LOW (ref 36.0–46.0)
Hemoglobin: 11.7 g/dL — ABNORMAL LOW (ref 12.0–15.0)
Immature Granulocytes: 0 %
Lymphocytes Relative: 14 %
Lymphs Abs: 1.5 10*3/uL (ref 0.7–4.0)
MCH: 28.5 pg (ref 26.0–34.0)
MCHC: 32.8 g/dL (ref 30.0–36.0)
MCV: 87.1 fL (ref 80.0–100.0)
Monocytes Absolute: 1.3 10*3/uL — ABNORMAL HIGH (ref 0.1–1.0)
Monocytes Relative: 12 %
Neutro Abs: 7.6 10*3/uL (ref 1.7–7.7)
Neutrophils Relative %: 73 %
Platelets: 246 10*3/uL (ref 150–400)
RBC: 4.1 MIL/uL (ref 3.87–5.11)
RDW: 13.2 % (ref 11.5–15.5)
WBC: 10.5 10*3/uL (ref 4.0–10.5)
nRBC: 0 % (ref 0.0–0.2)

## 2021-10-09 LAB — BASIC METABOLIC PANEL
Anion gap: 8 (ref 5–15)
BUN: 12 mg/dL (ref 8–23)
CO2: 27 mmol/L (ref 22–32)
Calcium: 8.9 mg/dL (ref 8.9–10.3)
Chloride: 104 mmol/L (ref 98–111)
Creatinine, Ser: 0.75 mg/dL (ref 0.44–1.00)
GFR, Estimated: >60 mL/min (ref >60)
Glucose, Bld: 97 mg/dL (ref 70–99)
Potassium: 3.7 mmol/L (ref 3.5–5.1)
Sodium: 139 mmol/L (ref 135–145)

## 2021-10-09 LAB — MAGNESIUM: Magnesium: 2 mg/dL (ref 1.7–2.4)

## 2021-10-09 MED ORDER — SODIUM CHLORIDE 0.9 % IV SOLN: INTRAVENOUS | Status: DC | PRN

## 2021-10-09 NOTE — Progress Notes (Signed)
Progress Note    Sarah Mcconnell   JOA:[id number]  DOB: Jul 09, 1961  DOA: 10/07/2021     0 PCP: [facility]  Initial CC: right hip pain  Hospital Course: Sarah Mcconnell is a 60 yo female with PMH Parkinson's disease, TIA, COPD, mild intermittent asthma, peripheral neuropathy who presented to the hospital with right hip pain.  She endorsed pain started approximately 1 to 2 days prior to admission and is felt worse in her right hip but some down towards her right lower thigh.  She has had difficulty walking due to this. In addition on admission, she admitted to having unprotected sexual intercourse for the first time in several years.  She expressed concern for possible infection related to this. She was found to have low-grade fever on work-up, 100.8.  WBC elevated, 12.1 initially.  CT right hip showed small right effusion.  She was recommended for MRI to better evaluate however declined.  Therefore, she was recommended to undergo joint aspiration with IR. She also underwent CT L-spine which showed mild disc bulge at L3-4 with no significant spinal stenosis.  Interval History:  No events overnight.  Still having pain in her right hip.  Seems to be possibly mildly improved compared to yesterday.  Daughter present bedside. Leukocytosis improved, but low-grade temperature noted overnight, 100.3.  Assessment & Plan: * Acute right hip pain - Patient presenting with 2-day history of progressively worsening right hip pain; on arrival, patient found to be febrile with elevated inflammatory markers and leukocytosis -Differential includes septic arthritis versus inflammation due to underlying arthritic changes -CT right hip shows small effusion, patient declining MRI.  Follow-up joint aspiration from IR: no crystals, but due to clot unable to quantify WBC count, but 82% neutrophils noted - patient started on vanc and rocephin per ortho on 11/8 - plan for now is to  continue monitor clinical response but still possibility of surgical intervention - NPO at MN  Septic arthritis (HCC) - see right hip pain   SIRS (systemic inflammatory response syndrome) (HCC) - See above  History of sexual intercourse - Patient endorsed recent sexual intercourse prior to onset of right hip pain.  She endorsed intercourse was unprotected and was concerned for possible infection contributing to her pain - Follow-up GC/CHL - HIV negative (though technically would need repeat in ~1 month if high concern as testing would not have had time for seroconversion)  Hypokalemia - Replete and recheck as needed   Parkinson disease (HCC) - Continue home Sinemet  COPD with asthma (HCC) - No signs or symptoms of exacerbation - Continue breathing treatments     Old records reviewed in assessment of this patient  Antimicrobials: Vancomycin 10/08/2021 >> current Rocephin 10/08/2021 >> current  DVT prophylaxis: holding in case of surgery in am; SCD for now   Code Status:   Code Status: Full Code  Disposition Plan: Status is: Inpatient  Remains inpatient appropriate because: ongoing treatment of above   Objective: Blood pressure 107/68, pulse 83, temperature (!) 97.5 F (36.4 C), temperature source Oral, resp. rate 18, height 5\' 9"  (1.753 m), weight 81.6 kg, SpO2 100 %.  Examination: Physical Exam Constitutional:      General: She is not in acute distress.    Appearance: Normal appearance.     Comments: Uncomfortable appearing  HENT:     Mouth/Throat:     Mouth: Mucous membranes are moist.  Eyes:     Extraocular Movements: Extraocular movements intact.  Cardiovascular:  Rate and Rhythm: Normal rate and regular rhythm.  Pulmonary:     Effort: Pulmonary effort is normal.     Breath sounds: Normal breath sounds.  Abdominal:     General: Bowel sounds are normal. There is no distension.     Palpations: Abdomen is soft.     Tenderness: There is no abdominal  tenderness.  Musculoskeletal:     Cervical back: Normal range of motion and neck supple.     Comments: Pain with passive range of motion in the right hip.  Decreased active range of motion in the right hip due to pain  Skin:    General: Skin is warm and dry.  Neurological:     Mental Status: She is alert.     Comments: No true focal deficit, strength in right lower extremity limited by pain in hip. Upper extremity resting tremor appreciated  Psychiatric:        Mood and Affect: Mood normal.        Behavior: Behavior normal.     Consultants:  Ortho IR  Procedures:  R hip joint aspiration, 10/08/21  Data Reviewed: I have personally reviewed labs and imaging studies No new findings    LOS: 0 days  Time spent: Greater than 50% of the 35 minute visit was spent in counseling/coordination of care for the patient as laid out in the A&P.   Sarah Chamber, MD [facility] 10/09/2021, 4:32 PM

## 2021-10-09 NOTE — Assessment & Plan Note (Signed)
-   see right hip pain

## 2021-10-09 NOTE — Progress Notes (Signed)
Patient's hip feeling much better, and per RN she has only required one dose of pain meds today. Synovial WBC was unable to be performed due to clot in specimen. Gram stain negative. Culture NGTD. Serum WBC down to 10.5 today after abx o/n. Currently afebrile. Will continue to monitor culture. NPO after MN in case she needs surgery tomorrow.

## 2021-10-09 NOTE — Plan of Care (Signed)
  Problem: Coping: Goal: Level of anxiety will decrease Outcome: Progressing   Problem: Pain Managment: Goal: General experience of comfort will improve Outcome: Progressing   

## 2021-10-10 LAB — CBC WITH DIFFERENTIAL/PLATELET
Abs Immature Granulocytes: 0.04 10*3/uL (ref 0.00–0.07)
Basophils Absolute: 0 10*3/uL (ref 0.0–0.1)
Basophils Relative: 0 %
Eosinophils Absolute: 0.2 10*3/uL (ref 0.0–0.5)
Eosinophils Relative: 2 %
HCT: 34.9 % — ABNORMAL LOW (ref 36.0–46.0)
Hemoglobin: 11.7 g/dL — ABNORMAL LOW (ref 12.0–15.0)
Immature Granulocytes: 0 %
Lymphocytes Relative: 16 %
Lymphs Abs: 1.5 10*3/uL (ref 0.7–4.0)
MCH: 29 pg (ref 26.0–34.0)
MCHC: 33.5 g/dL (ref 30.0–36.0)
MCV: 86.6 fL (ref 80.0–100.0)
Monocytes Absolute: 0.9 10*3/uL (ref 0.1–1.0)
Monocytes Relative: 9 %
Neutro Abs: 6.8 10*3/uL (ref 1.7–7.7)
Neutrophils Relative %: 73 %
Platelets: 247 10*3/uL (ref 150–400)
RBC: 4.03 MIL/uL (ref 3.87–5.11)
RDW: 13 % (ref 11.5–15.5)
WBC: 9.4 10*3/uL (ref 4.0–10.5)
nRBC: 0 % (ref 0.0–0.2)

## 2021-10-10 LAB — BASIC METABOLIC PANEL
Anion gap: 7 (ref 5–15)
BUN: 14 mg/dL (ref 8–23)
CO2: 25 mmol/L (ref 22–32)
Calcium: 9 mg/dL (ref 8.9–10.3)
Chloride: 105 mmol/L (ref 98–111)
Creatinine, Ser: 0.69 mg/dL (ref 0.44–1.00)
GFR, Estimated: >60 mL/min (ref >60)
Glucose, Bld: 122 mg/dL — ABNORMAL HIGH (ref 70–99)
Potassium: 3.8 mmol/L (ref 3.5–5.1)
Sodium: 137 mmol/L (ref 135–145)

## 2021-10-10 LAB — GC/CHLAMYDIA PROBE AMP (~~LOC~~) NOT AT ARMC
Chlamydia: NEGATIVE
Comment: NEGATIVE
Comment: NORMAL
Neisseria Gonorrhea: NEGATIVE

## 2021-10-10 LAB — MAGNESIUM: Magnesium: 2 mg/dL (ref 1.7–2.4)

## 2021-10-10 MED ORDER — FLUCONAZOLE 150 MG PO TABS
150.0000 mg | ORAL_TABLET | Freq: Once | ORAL | Status: DC
  Filled 2021-10-10: qty 1

## 2021-10-10 NOTE — Progress Notes (Signed)
Progress Note    Monnie Gudgel   QIO:[id number]  DOB: 14-Dec-1960  DOA: 10/07/2021     1 PCP: [facility]  Initial CC: right hip pain  Hospital Course: Ms. Lagares is a 60 yo female with PMH Parkinson's disease, TIA, COPD, mild intermittent asthma, peripheral neuropathy who presented to the hospital with right hip pain.  She endorsed pain started approximately 1 to 2 days prior to admission and is felt worse in her right hip but some down towards her right lower thigh.  She has had difficulty walking due to this. In addition on admission, she admitted to having unprotected sexual intercourse for the first time in several years.  She expressed concern for possible infection related to this. She was found to have low-grade fever on work-up, 100.8.  WBC elevated, 12.1 initially.  CT right hip showed small right effusion.  She was recommended for MRI to better evaluate however declined.  Therefore, she was recommended to undergo joint aspiration with IR. She also underwent CT L-spine which showed mild disc bulge at L3-4 with no significant spinal stenosis.  Interval History:  No events overnight.  Still having pain in her right hip.  Was sitting up on edge of bed today about to order food. In general it seems the pain has improved some but still present to her and is felt more with hip flexion/extension.   Assessment & Plan: * Acute right hip pain - Patient presenting with 2-day history of progressively worsening right hip pain; on arrival, patient found to be febrile with elevated inflammatory markers and leukocytosis -Differential includes septic arthritis versus inflammation due to underlying arthritic changes -CT right hip shows small effusion, patient declining MRI.  Follow-up joint aspiration from IR: no crystals, but due to clot unable to quantify WBC count, but 82% neutrophils noted - patient started on vanc and rocephin per ortho on 11/8 - plan for now  is to continue monitor clinical response but still possibility of surgical intervention - GC/CHL is negative - NPO at MN  Septic arthritis (HCC) - see right hip pain   History of sexual intercourse - Patient endorsed recent sexual intercourse prior to onset of right hip pain.  She endorsed intercourse was unprotected and was concerned for possible infection contributing to her pain - GC/CHL is negative (has resulted on 11/10) - HIV negative (though technically would need repeat in ~1 month if high concern as testing would not have had time for seroconversion)  Hypokalemia - Replete and recheck as needed   Sepsis (HCC) - See above  Parkinson disease (HCC) - Continue home Sinemet  COPD with asthma (HCC) - No signs or symptoms of exacerbation - Continue breathing treatments     Old records reviewed in assessment of this patient  Antimicrobials: Vancomycin 10/08/2021 >> current Rocephin 10/08/2021 >> current  DVT prophylaxis: holding in case of surgery in am; SCD for now   Code Status:   Code Status: Full Code  Disposition Plan: Status is: Inpatient   Objective: Blood pressure 116/81, pulse 89, temperature 98.3 F (36.8 C), temperature source Oral, resp. rate 16, height 5\' 9"  (1.753 m), weight 81.6 kg, SpO2 100 %.  Examination: Physical Exam Constitutional:      General: She is not in acute distress.    Appearance: Normal appearance.     Comments: Uncomfortable appearing  HENT:     Mouth/Throat:     Mouth: Mucous membranes are moist.  Eyes:  Extraocular Movements: Extraocular movements intact.  Cardiovascular:     Rate and Rhythm: Normal rate and regular rhythm.  Pulmonary:     Effort: Pulmonary effort is normal.     Breath sounds: Normal breath sounds.  Abdominal:     General: Bowel sounds are normal. There is no distension.     Palpations: Abdomen is soft.     Tenderness: There is no abdominal tenderness.  Musculoskeletal:     Cervical back: Normal  range of motion and neck supple.     Comments: Pain with active/passive range of motion in the right hip.  Decreased active range of motion in the right hip due to pain  Skin:    General: Skin is warm and dry.  Neurological:     Mental Status: She is alert.     Comments: No true focal deficit, strength in right lower extremity limited by pain in hip. Upper extremity resting tremor appreciated  Psychiatric:        Mood and Affect: Mood normal.        Behavior: Behavior normal.     Consultants:  Ortho IR  Procedures:  R hip joint aspiration, 10/08/21  Data Reviewed: I have personally reviewed labs and imaging studies No new findings    LOS: 1 day  Time spent: Greater than 50% of the 35 minute visit was spent in counseling/coordination of care for the patient as laid out in the A&P.   Lewie Chamber, MD [facility] 10/10/2021, 3:19 PM

## 2021-10-10 NOTE — Plan of Care (Signed)
  Problem: Education: Goal: Knowledge of General Education information will improve Description: Including pain rating scale, medication(s)/side effects and non-pharmacologic comfort measures Outcome: Progressing   Problem: Activity: Goal: Risk for activity intolerance will decrease Outcome: Progressing   Problem: Pain Managment: Goal: General experience of comfort will improve Outcome: Progressing   

## 2021-10-10 NOTE — Plan of Care (Signed)
  Problem: Coping: Goal: Level of anxiety will decrease Outcome: Progressing   Problem: Pain Managment: Goal: General experience of comfort will improve Outcome: Progressing   

## 2021-10-10 NOTE — Progress Notes (Signed)
Subjective:  Patient seen and examined. States hip is doing a little better.  Objective:   VITALS:   Vitals:   10/09/21 0357 10/09/21 1351 10/09/21 1932 10/10/21 0601  BP: 136/82 107/68 120/73 128/81  Pulse: 72 83 72 87  Resp: 18 18 20 18   Temp: 99.1 F (37.3 C) (!) 97.5 F (36.4 C) 98.6 F (37 C) 98.5 F (36.9 C)  TempSrc: Oral Oral Oral Oral  SpO2: 94% 100% 100% 97%  Weight:      Height:        NAD, resting comfortable in bed with hip extended Sensation intact distally Intact pulses distally Dorsiflexion/Plantar flexion intact No cellulitis present Painless logroll of hip when distracted with conversation   Lab Results  Component Value Date   WBC 9.4 10/10/2021   HGB 11.7 (L) 10/10/2021   HCT 34.9 (L) 10/10/2021   MCV 86.6 10/10/2021   PLT 247 10/10/2021   BMET    Component Value Date/Time   NA 137 10/10/2021 0327   K 3.8 10/10/2021 0327   CL 105 10/10/2021 0327   CO2 25 10/10/2021 0327   GLUCOSE 122 (H) 10/10/2021 0327   BUN 14 10/10/2021 0327   CREATININE 0.69 10/10/2021 0327   CALCIUM 9.0 10/10/2021 0327   GFRNONAA >60 10/10/2021 0327    Recent Results (from the past 240 hour(s))  Resp Panel by RT-PCR (Flu A&B, Covid) Nasopharyngeal Swab     Status: None   Collection Time: 10/07/21 11:23 PM   Specimen: Nasopharyngeal Swab; Nasopharyngeal(NP) swabs in vial transport medium  Result Value Ref Range Status   SARS Coronavirus 2 by RT PCR NEGATIVE NEGATIVE Final    Comment: (NOTE) SARS-CoV-2 target nucleic acids are NOT DETECTED.  The SARS-CoV-2 RNA is generally detectable in upper respiratory specimens during the acute phase of infection. The lowest concentration of SARS-CoV-2 viral copies this assay can detect is 138 copies/mL. A negative result does not preclude SARS-Cov-2 infection and should not be used as the sole basis for treatment or other patient management decisions. A negative result may occur with  improper specimen  collection/handling, submission of specimen other than nasopharyngeal swab, presence of viral mutation(s) within the areas targeted by this assay, and inadequate number of viral copies(<138 copies/mL). A negative result must be combined with clinical observations, patient history, and epidemiological information. The expected result is Negative.  Fact Sheet for Patients:  13/07/22  Fact Sheet for Healthcare Providers:  BloggerCourse.com  This test is no t yet approved or cleared by the [employer]ious[employer]ker.[place] FDA and  has been authorized for detection and/or diagnosis of SARS-CoV-2 by FDA under an Emergency Use Authorization (EUA). This EUA will remain  in effect (meaning this test can be used) for the duration of the COVID-19 declaration under Section 564(b)(1) of the Act, 21 U.S.C.section 360bbb-3(b)(1), unless the authorization is terminated  or revoked sooner.       Influenza A by PCR NEGATIVE NEGATIVE Final   Influenza B by PCR NEGATIVE NEGATIVE Final    Comment: (NOTE) The Xpert Xpress SARS-CoV-2/FLU/RSV plus assay is intended as an aid in the diagnosis of influenza from Nasopharyngeal swab specimens and should not be used as a sole basis for treatment. Nasal washings and aspirates are unacceptable for Xpert Xpress SARS-CoV-2/FLU/RSV testing.  Fact Sheet for Patients: [place]  Fact Sheet for Healthcare Providers: BloggerCourse.com  This test is not yet approved or cleared by the SeriousBroker.it FDA and has been authorized for detection and/or diagnosis of SARS-CoV-2  by FDA under an Emergency Use Authorization (EUA). This EUA will remain in effect (meaning this test can be used) for the duration of the COVID-19 declaration under Section 564(b)(1) of the Act, 21 U.S.C. section 360bbb-3(b)(1), unless the authorization is terminated or revoked.  Performed at [facility], [address] [place], [place] [address]   Blood culture (routine x 2)     Status: None (Preliminary result)   Collection Time: 10/08/21 12:05 AM   Specimen: BLOOD  Result Value Ref Range Status   Specimen Description   Final    BLOOD RIGHT ANTECUBITAL Performed at [facility], [address] [place], [place] [address]    Special Requests   Final    BOTTLES DRAWN AEROBIC AND ANAEROBIC Blood Culture results may not be optimal due to an inadequate volume of blood received in culture bottles Performed at [facility], [address], [place], [place] [address]    Culture   Final    NO GROWTH 2 DAYS Performed at [facility], [address] [place], [place] [address]    Report Status PENDING  Incomplete  Body fluid culture w Gram Stain     Status: None (Preliminary result)   Collection Time: 10/08/21  4:00 PM   Specimen: Joint, Hip; Body Fluid  Result Value Ref Range Status   Specimen Description   Final    HIP RIGHT Performed at [facility], [address] [place], [place] [address]    Special Requests   Final    NONE Performed at [facility], [address], [place], [place] [address]    Gram Stain   Final    NO SQUAMOUS EPITHELIAL CELLS SEEN MODERATE WBC SEEN NO ORGANISMS SEEN    Culture   Final    NO GROWTH 2 DAYS Performed at [facility], [address] [place], [place] [address]    Report Status PENDING  Incomplete     Assessment/Plan:     Principal Problem:   Acute right hip pain Active Problems:   SIRS (systemic inflammatory response syndrome) (HCC)   COPD with asthma (HCC)   Parkinson disease (HCC)   Hypokalemia   History of sexual intercourse   Septic arthritis (HCC)   WBAT  R hip NSA: continue Vancomycin and Ceftriaxone, patient currently stable and afebrile, exam improving, WBC normal, culture NGTD, she may have GC NSA which is non  operative Dispo: follow culture, NPO after MN   Iline Oven Zolton Dowson 10/10/2021, 12:08 PM   Samson Frederic, MD [phone] [facility] is now [facility] [address], [address], [place], [place] [address] Phone: [phone] [email or web address] Facebook  [employer]

## 2021-10-10 NOTE — TOC Initial Note (Signed)
Transition of Care [facility]) - Initial/Assessment Note   Patient Details  Name: Sarah Mcconnell MRN: [id number] Date of Birth: [birth year]  Transition of Care [facility]) CM/SW Contact:    Ewing Schlein, LCSW Phone Number: 10/10/2021, 9:48 AM  Clinical Narrative: CSW received call from patient's [facility] social worker, Marchelle Folks [phone]), requesting an update on patient so that discharge needs can be addressed. CSW informed Marchelle Folks that surgery is a possibility, but patient's next level of care has not been determined as it is unclear if patient will need surgery or not and PT will assess the patient after that is decided. TOC to follow.  Expected Discharge Plan: Skilled Nursing Facility Barriers to Discharge: Continued Medical Work up  Expected Discharge Plan and Services Expected Discharge Plan: Skilled Nursing Facility In-house Referral: Clinical Social Work Living arrangements for the past 2 months: Apartment  Prior Living Arrangements/Services Living arrangements for the past 2 months: Apartment Patient language and need for interpreter reviewed:: Yes Need for Family Participation in Patient Care: No (Comment) Care giver support system in place?: Yes (comment) Criminal Activity/Legal Involvement Pertinent to Current Situation/Hospitalization: Yes - Comment as needed  Activities of Daily Living Home Assistive Devices/Equipment: Eyeglasses ADL Screening (condition at time of admission) Patient's cognitive ability adequate to safely complete daily activities?: Yes Is the patient deaf or have difficulty hearing?: No Does the patient have difficulty seeing, even when wearing glasses/contacts?: No Does the patient have difficulty concentrating, remembering, or making decisions?: No Patient able to express need for assistance with ADLs?: Yes Does the patient have difficulty dressing or bathing?: Yes (secondary to right hip pain and not being able to walk) Independently performs  ADLs?: No (secondary to right hip pain and not being able to walk) Communication: Independent Dressing (OT): Needs assistance Is this a change from baseline?: Change from baseline, expected to last >3 days Grooming: Independent Feeding: Independent Bathing: Needs assistance Is this a change from baseline?: Change from baseline, expected to last >3 days Toileting: Dependent Is this a change from baseline?: Change from baseline, expected to last >3days In/Out Bed: Dependent Is this a change from baseline?: Change from baseline, expected to last >3 days Walks in Home: Dependent Is this a change from baseline?: Change from baseline, expected to last >3 days Does the patient have difficulty walking or climbing stairs?: Yes (secondary to right hip pain and not being able to walk) Weakness of Legs: Right Weakness of Arms/Hands: None  Emotional Assessment Orientation: : Oriented to Self, Oriented to Place, Oriented to  Time, Oriented to Situation Alcohol / Substance Use: Not Applicable  Admission diagnosis:  Pneumonia [J18.9] Right hip pain [M25.551] Right hip joint effusion [M25.451] Acute right hip pain [M25.551] Fever, unspecified fever cause [R50.9] Septic arthritis (HCC) [M00.9] Patient Active Problem List   Diagnosis Date Noted   Septic arthritis (HCC) 10/09/2021   Acute right hip pain 10/08/2021   SIRS (systemic inflammatory response syndrome) (HCC) 10/08/2021   COPD with asthma (HCC) 10/08/2021   History of sexual intercourse 10/08/2021   Parkinson disease (HCC)    Hypokalemia    Seborrheic dermatitis 09/13/2018   Idiopathic peripheral neuropathy 11/26/2015   PCP:  [facility], [facility]:   [id number] - [place], [place] - [address] AT [facility] [address] [place] [address] Phone: [phone] Fax: [phone]  Readmission Risk Interventions No flowsheet data found.

## 2021-10-11 DIAGNOSIS — M009 Pyogenic arthritis, unspecified: Secondary | ICD-10-CM

## 2021-10-11 LAB — CBC WITH DIFFERENTIAL/PLATELET
Abs Immature Granulocytes: 0.05 10*3/uL (ref 0.00–0.07)
Basophils Absolute: 0 10*3/uL (ref 0.0–0.1)
Basophils Relative: 0 %
Eosinophils Absolute: 0.2 10*3/uL (ref 0.0–0.5)
Eosinophils Relative: 2 %
HCT: 34.8 % — ABNORMAL LOW (ref 36.0–46.0)
Hemoglobin: 11.9 g/dL — ABNORMAL LOW (ref 12.0–15.0)
Immature Granulocytes: 1 %
Lymphocytes Relative: 17 %
Lymphs Abs: 1.6 10*3/uL (ref 0.7–4.0)
MCH: 29.1 pg (ref 26.0–34.0)
MCHC: 34.2 g/dL (ref 30.0–36.0)
MCV: 85.1 fL (ref 80.0–100.0)
Monocytes Absolute: 0.7 10*3/uL (ref 0.1–1.0)
Monocytes Relative: 7 %
Neutro Abs: 7.4 10*3/uL (ref 1.7–7.7)
Neutrophils Relative %: 73 %
Platelets: 309 10*3/uL (ref 150–400)
RBC: 4.09 MIL/uL (ref 3.87–5.11)
RDW: 12.9 % (ref 11.5–15.5)
WBC: 9.9 10*3/uL (ref 4.0–10.5)
nRBC: 0 % (ref 0.0–0.2)

## 2021-10-11 LAB — BASIC METABOLIC PANEL
Anion gap: 10 (ref 5–15)
BUN: 9 mg/dL (ref 8–23)
CO2: 25 mmol/L (ref 22–32)
Calcium: 9.4 mg/dL (ref 8.9–10.3)
Chloride: 101 mmol/L (ref 98–111)
Creatinine, Ser: 0.6 mg/dL (ref 0.44–1.00)
GFR, Estimated: >60 mL/min (ref >60)
Glucose, Bld: 123 mg/dL — ABNORMAL HIGH (ref 70–99)
Potassium: 3.6 mmol/L (ref 3.5–5.1)
Sodium: 136 mmol/L (ref 135–145)

## 2021-10-11 LAB — MAGNESIUM: Magnesium: 2.3 mg/dL (ref 1.7–2.4)

## 2021-10-11 MED ORDER — SODIUM CHLORIDE 0.9% FLUSH: 10.0000 mL | INTRAVENOUS | Status: DC | PRN

## 2021-10-11 MED ORDER — SODIUM CHLORIDE 0.9 % IV SOLN
2.0000 g | Freq: Every day | INTRAVENOUS | Status: DC
  Administered 2021-10-11 – 2021-10-13 (×3): 2 g via INTRAVENOUS
  Filled 2021-10-11 (×5): qty 20

## 2021-10-11 MED ORDER — ALBUTEROL SULFATE HFA 108 (90 BASE) MCG/ACT IN AERS
1.0000 | INHALATION_SPRAY | RESPIRATORY_TRACT | Status: DC | PRN
  Administered 2021-10-11: 2 via RESPIRATORY_TRACT
  Filled 2021-10-11: qty 6.7

## 2021-10-11 MED ORDER — SODIUM CHLORIDE 0.9% FLUSH
10.0000 mL | Freq: Two times a day (BID) | INTRAVENOUS | Status: DC
  Administered 2021-10-11 – 2021-10-13 (×5): 10 mL

## 2021-10-11 NOTE — Consult Note (Signed)
[facility]    Date of Admission:  10/07/2021   Total days of antibiotics: 4        Reason for Consult: Right hip septic arthritis   Principal Problem:   Acute right hip pain Active Problems:   Sepsis (HCC)   COPD with asthma (HCC)   Parkinson disease (HCC)   Hypokalemia   History of sexual intercourse   Septic arthritis [name])   Assessment: 60 year old female with Parkinson's disease, onychomycosis, history of smoking crack/cocaine, last used 2016 presented with right hip pain after sexual activity intercourse.  On arrival she had a temp of 100.8, WBC 12.1.  CT showed trace right hip effusion.  Following aspiration she was started on vancomycin and ceftriaxone.   #Native right hip septic arthritis -Patient declined MRI and underwent CT-guided aspiration, purulent fluid was aspirated form right hip.  The synovial cell count is in-conclusive due to clot in specimen,. Although Cx are negative I suspect septic arthritis based on initial clinical presentation and documented procedure finding of purulence.  -GC/chlamydia urine negative  Recommendations:  -Continue broad spectrum IV antibiotics inpatient. Would recommend ceftriaxone for atleast 7 days(till 11/14) as gonococcal septic arthritis is possible. -On discharge transition to Doxycyline 100mg  PO bid and cefadroxil 500mg  PO bid to complete 4 weeks of treatment from aspiration (11/8-12/5) -Follow with ID, will schedule appoitnment  ID will sign off Microbiology:  Antibiotics: Ceftriaxone and vancomycin 11/8-present  Cultures:  11/8 right hip aspirate: Cell count-unable to perform count due to clot, neutrophil  82%, 4%lymphs, 14% mono. No crystals Cx: NGTD x3, no organisms seen on gram stain  11/8 Urine GC/chlamydia negative  11/8 blood Cx NGTD HPI: Sarah Mcconnell is a 60 y.o. female Parkinson's disease, onychomycosis, tubular adenoma, peripheral neuropathy, previous TIA, polysubstance  abuse(smoked crack/cocaine-quit 2016) admitted for right hip septic arthritis. Presented with right hip pain that started shortly after intercourse.  Patient reports that she had been abstinent form sex for 9 years.  On arrival to the ED, WBC 12 .1K, temp 100.8.  CT of right hip showed trace fluid hip effusion.  Orthopedics was consulted and recommended MRI.,  Patient declined.  She underwent CT-guided aspiration, with purulent fluid aspirated.  Due to clot cell count could not be performed, cultures negative so far.  No plans for washout as of yet.  ID consulted for antibiotic recommendations Today, patient reports she has almost full range of motion in her right.  She reports it is feeling much better.  She denies fever, chills, nausea, vomiting, diarrhea.  She reports having trichomonas years ago but that was her only STI.   Review of Systems: ROS  Past Medical History:  Diagnosis Date   Asthma    COPD (chronic obstructive pulmonary disease) (HCC)    Dry eye syndrome    Idiopathic peripheral neuropathy 11/26/2015   Onychomycosis    of finger nails   Parkinson disease (HCC)    Seborrheic dermatitis 09/13/2018   Stroke (HCC)    Substance abuse (HCC)    crack cocaine and dependency, in remission since 2012    Social History   Tobacco Use   Smoking status: Former   Smokeless tobacco: Never  Substance Use Topics   Alcohol use: Yes   Drug use: Never    Family History  Problem Relation Age of Onset   Heart disease Other    Scheduled Meds:  carbidopa-levodopa  1 tablet Oral BID   sodium  chloride flush  10-40 mL Intracatheter Q12H   trihexyphenidyl  2 mg Oral BID WC   Continuous Infusions:  sodium chloride Stopped (10/11/21 1502)   cefTRIAXone (ROCEPHIN)  IV 200 mL/hr at 10/11/21 1504   vancomycin Stopped (10/11/21 1420)   PRN Meds:.sodium chloride, acetaminophen **OR** acetaminophen, albuterol, oxyCODONE-acetaminophen **OR** morphine injection, ondansetron **OR** ondansetron  (ZOFRAN) IV, polyethylene glycol, sodium chloride flush No Known Allergies  OBJECTIVE: Blood pressure 125/80, pulse 91, temperature 98.8 F (37.1 C), temperature source Oral, resp. rate 18, height 5\' 9"  (1.753 m), weight 81.6 kg, SpO2 99 %.  Physical Exam Constitutional:      Appearance: Normal appearance.  HENT:     Head: Normocephalic and atraumatic.     Right Ear: Tympanic membrane normal.     Left Ear: Tympanic membrane normal.     Nose: Nose normal.     Mouth/Throat:     Mouth: Mucous membranes are moist.  Eyes:     Extraocular Movements: Extraocular movements intact.     Conjunctiva/sclera: Conjunctivae normal.     Pupils: Pupils are equal, round, and reactive to light.  Cardiovascular:     Rate and Rhythm: Normal rate and regular rhythm.     Heart sounds: No murmur heard.   No friction rub. No gallop.  Pulmonary:     Effort: Pulmonary effort is normal.     Breath sounds: Normal breath sounds.  Abdominal:     General: Abdomen is flat.     Palpations: Abdomen is soft.  Musculoskeletal:        General: Normal range of motion.  Skin:    General: Skin is warm and dry.  Neurological:     General: No focal deficit present.     Mental Status: She is alert and oriented to person, place, and time.     Comments: Resting tremors   Psychiatric:        Mood and Affect: Mood normal.    Lab Results Lab Results  Component Value Date   WBC 9.9 10/11/2021   HGB 11.9 (L) 10/11/2021   HCT 34.8 (L) 10/11/2021   MCV 85.1 10/11/2021   PLT 309 10/11/2021    Lab Results  Component Value Date   CREATININE 0.60 10/11/2021   BUN 9 10/11/2021   NA 136 10/11/2021   K 3.6 10/11/2021   CL 101 10/11/2021   CO2 25 10/11/2021    Lab Results  Component Value Date   ALT 14 10/08/2021   AST 17 10/08/2021   ALKPHOS 58 10/08/2021   BILITOT 1.3 (H) 10/08/2021       13/07/2021, MD [facility] 10/11/2021, 10:54 PM

## 2021-10-11 NOTE — Progress Notes (Signed)
Progress Note    Sarah Mcconnell   PPI:[id number]  DOB: 13-Aug-1961  DOA: 10/07/2021     2 PCP: [facility]  Initial CC: right hip pain  Hospital Course: [name] is a 60 yo female with PMH Parkinson's disease, TIA, COPD, mild intermittent asthma, peripheral neuropathy who presented to the hospital with right hip pain.  She endorsed pain started approximately 1 to 2 days prior to admission and is felt worse in her right hip but some down towards her right lower thigh.  She has had difficulty walking due to this. In addition on admission, she admitted to having unprotected sexual intercourse for the first time in several years.  She expressed concern for possible infection related to this. She was found to have low-grade fever on work-up, 100.8.  WBC elevated, 12.1 initially.  CT right hip showed small right effusion.  She was recommended for MRI to better evaluate however declined.  Therefore, she was recommended to undergo joint aspiration with IR. She also underwent CT L-spine which showed mild disc bulge at L3-4 with no significant spinal stenosis.  Interval History:  No events overnight.  She has been stating that the pain has felt mildly better each day but still present.  She was hungry again when seen and asking to eat.   Assessment & Plan: * Acute right hip pain - Patient presenting with 2-day history of progressively worsening right hip pain; on arrival, patient found to be febrile with elevated inflammatory markers and leukocytosis -Differential includes septic arthritis versus inflammation due to underlying arthritic changes -CT right hip shows small effusion, patient declining MRI.  Follow-up joint aspiration from IR: no crystals, but due to clot unable to quantify WBC count, but 82% neutrophils noted. Concern is septic arthritis at this time - patient started on vanc and rocephin per ortho on 11/8 - plan for now is to continue monitor  clinical response but still possibility of surgical intervention (no immediate plans for surgical intervention per ortho) - GC/CHL is negative - ortho has recommended to have ID consult for prolonged abx   Septic arthritis (HCC) - see right hip pain   History of sexual intercourse - Patient endorsed recent sexual intercourse prior to onset of right hip pain.  She endorsed intercourse was unprotected and was concerned for possible infection contributing to her pain - GC/CHL is negative (has resulted on 11/10) - HIV negative (though technically would need repeat in ~1 month if high concern as testing would not have had time for seroconversion)  Hypokalemia - Replete and recheck as needed   Sepsis (HCC) - See above  Parkinson disease (HCC) - Continue home Sinemet  COPD with asthma (HCC) - No signs or symptoms of exacerbation - Continue breathing treatments     Old records reviewed in assessment of this patient  Antimicrobials: Vancomycin 10/08/2021 >> current Rocephin 10/08/2021 >> current  DVT prophylaxis: holding in case of surgery in am; SCD for now   Code Status:   Code Status: Full Code  Disposition Plan: Status is: Inpatient   Objective: Blood pressure (!) 118/91, pulse 85, temperature 98.9 F (37.2 C), temperature source Oral, resp. rate 18, height 5\' 9"  (1.753 m), weight 81.6 kg, SpO2 98 %.  Examination: Physical Exam Constitutional:      General: She is not in acute distress.    Appearance: Normal appearance.  HENT:     Mouth/Throat:     Mouth: Mucous membranes are moist.  Eyes:  Extraocular Movements: Extraocular movements intact.  Cardiovascular:     Rate and Rhythm: Normal rate and regular rhythm.  Pulmonary:     Effort: Pulmonary effort is normal.     Breath sounds: Normal breath sounds.  Abdominal:     General: Bowel sounds are normal. There is no distension.     Palpations: Abdomen is soft.     Tenderness: There is no abdominal tenderness.   Musculoskeletal:     Cervical back: Normal range of motion and neck supple.     Comments: Pain with active/passive range of motion in the right hip (but improved from priors).  Skin:    General: Skin is warm and dry.  Neurological:     Mental Status: She is alert.     Comments: No true focal deficit, strength in right lower extremity limited by pain in hip (but strength has steadily improved over past 1-2 days) Upper extremity resting tremor is intermittent   Psychiatric:        Mood and Affect: Mood normal.        Behavior: Behavior normal.     Consultants:  Ortho IR ID  Procedures:  R hip joint aspiration, 10/08/21  Data Reviewed: I have personally reviewed labs and imaging studies No new findings    LOS: 2 days  Time spent: Greater than 50% of the 35 minute visit was spent in counseling/coordination of care for the patient as laid out in the A&P.   Lewie Chamber, MD [facility] 10/11/2021, 3:11 PM

## 2021-10-11 NOTE — Plan of Care (Signed)

## 2021-10-11 NOTE — Progress Notes (Signed)
Pharmacy Antibiotic Note  Sarah Mcconnell is a 60 y.o. female admitted on 10/07/2021 with septic arthritis.  Pharmacy has been consulted for vancomycin dosing.  Today, 10/11/2021: Day 3 full abx WBC normalized Afebrile since 11/9 SCr stable WNL  Plan: Continue vancomycin 1g IV q12h for estimated AUC 497 using SCr 0.8, Vd 0.72 Hold off on vancomycin levels for now as anticipating therapy < 7d Check vancomycin levels at steady state, goal AUC 400-550 Rocephin per MD; will increase to 2g daily given possible joint infection  Height: 5\' 9"  (175.3 cm) Weight: 81.6 kg (180 lb) IBW/kg (Calculated) : 66.2  Temp (24hrs), Avg:98.5 F (36.9 C), Min:98.3 F (36.8 C), Max:98.9 F (37.2 C)  Recent Labs  Lab 10/08/21 0005 10/08/21 0630 10/09/21 0326 10/10/21 0327 10/11/21 0918  WBC 12.1* 12.7* 10.5 9.4 9.9  CREATININE 0.72  --  0.75 0.69  --   LATICACIDVEN 1.0 0.8  --   --   --      Estimated Creatinine Clearance: 84.4 mL/min (by C-G formula based on SCr of 0.69 mg/dL).    No Known Allergies  Antimicrobials this admission: 11/8 CTX >> 11/8 Vancomycin >>   Dose adjustments this admission: 11/11 incr Rocephin 1>> 2 g for indication  Microbiology results: 11/8 BCx: ngtd 11/8 R Hip aspirate: ngtd   Thank you for allowing pharmacy to be a part of this patient's care.  13/8, PharmD, BCPS [phone] 10/11/2021, 9:48 AM

## 2021-10-12 LAB — CBC WITH DIFFERENTIAL/PLATELET
Abs Immature Granulocytes: 0.02 10*3/uL (ref 0.00–0.07)
Basophils Absolute: 0 10*3/uL (ref 0.0–0.1)
Basophils Relative: 0 %
Eosinophils Absolute: 0.2 10*3/uL (ref 0.0–0.5)
Eosinophils Relative: 2 %
HCT: 32.7 % — ABNORMAL LOW (ref 36.0–46.0)
Hemoglobin: 11 g/dL — ABNORMAL LOW (ref 12.0–15.0)
Immature Granulocytes: 0 %
Lymphocytes Relative: 16 %
Lymphs Abs: 1.3 10*3/uL (ref 0.7–4.0)
MCH: 28.8 pg (ref 26.0–34.0)
MCHC: 33.6 g/dL (ref 30.0–36.0)
MCV: 85.6 fL (ref 80.0–100.0)
Monocytes Absolute: 0.8 10*3/uL (ref 0.1–1.0)
Monocytes Relative: 11 %
Neutro Abs: 5.5 10*3/uL (ref 1.7–7.7)
Neutrophils Relative %: 71 %
Platelets: 304 10*3/uL (ref 150–400)
RBC: 3.82 MIL/uL — ABNORMAL LOW (ref 3.87–5.11)
RDW: 12.8 % (ref 11.5–15.5)
WBC: 7.8 10*3/uL (ref 4.0–10.5)
nRBC: 0 % (ref 0.0–0.2)

## 2021-10-12 LAB — BASIC METABOLIC PANEL
Anion gap: 8 (ref 5–15)
BUN: 11 mg/dL (ref 8–23)
CO2: 27 mmol/L (ref 22–32)
Calcium: 9 mg/dL (ref 8.9–10.3)
Chloride: 101 mmol/L (ref 98–111)
Creatinine, Ser: 0.67 mg/dL (ref 0.44–1.00)
GFR, Estimated: >60 mL/min (ref >60)
Glucose, Bld: 123 mg/dL — ABNORMAL HIGH (ref 70–99)
Potassium: 3.7 mmol/L (ref 3.5–5.1)
Sodium: 136 mmol/L (ref 135–145)

## 2021-10-12 LAB — BODY FLUID CULTURE W GRAM STAIN
Culture: NO GROWTH
Gram Stain: NONE SEEN

## 2021-10-12 LAB — MAGNESIUM: Magnesium: 2.1 mg/dL (ref 1.7–2.4)

## 2021-10-12 NOTE — Evaluation (Signed)
Physical Therapy One Time Evaluation Patient Details Name: Sarah Mcconnell MRN: [id number] DOB: 27-Aug-1961 Today's Date: 10/12/2021  History of Present Illness  60 yo female with PMH Parkinson's disease, TIA, COPD, mild intermittent asthma, peripheral neuropathy who presented to the hospital with right hip pain and admitted for suspected right hip septic arthritis.  Clinical Impression  Patient evaluated by Physical Therapy with no further acute PT needs identified. All education has been completed and the patient has no further questions.  Pt able to ambulate and perform stairs without any UE support or assistive devices.  Pt reports no increase in pain with mobility.  Pt encouraged to ambulate with nursing staff during remainder of hospitalization. See below for any follow-up Physical Therapy or equipment needs. PT is signing off. Thank you for this referral.      Recommendations for follow up therapy are one component of a multi-disciplinary discharge planning process, led by the attending physician.  Recommendations may be updated based on patient status, additional functional criteria and insurance authorization.  Follow Up Recommendations No PT follow up    Assistance Recommended at Discharge    Functional Status Assessment Patient has not had a recent decline in their functional status  Equipment Recommendations  None recommended by PT    Recommendations for Other Services       Precautions / Restrictions Precautions Precautions: None Precaution Comments: essential tremor      Mobility  Bed Mobility Overal bed mobility: Modified Independent                  Transfers Overall transfer level: Needs assistance Equipment used: None Transfers: Sit to/from Stand Sit to Stand: Supervision           General transfer comment: supervision for safety    Ambulation/Gait Ambulation/Gait assistance: Min guard;Supervision Gait Distance (Feet): 400 Feet Assistive  device: None Gait Pattern/deviations: Narrow base of support;Decreased stride length;Step-through pattern       General Gait Details: pt reports no increase of hip pain, declined assistive device, steady during ambulation  Stairs Stairs: Yes Stairs assistance: Min guard;Supervision Stair Management: Alternating pattern;Forwards;No rails Number of Stairs: 3 General stair comments: provided cues for step to and sequence however pt able to perform step through with no difficulty; pt performed 4 steps x3 and also one step multiple times  Wheelchair Mobility    Modified Rankin (Stroke Patients Only)       Balance Overall balance assessment: No apparent balance deficits (not formally assessed)                                           Pertinent Vitals/Pain Pain Assessment: Faces Faces Pain Scale: Hurts a little bit ("mild") Pain Location: right hip Pain Descriptors / Indicators: Dull Pain Intervention(s): Repositioned;Monitored during session    Home Living Family/patient expects to be discharged to:: Private residence Living Arrangements: Children;Other relatives   Type of Home: Apartment Home Access: Stairs to enter Entrance Stairs-Rails: Right Entrance Stairs-Number of Steps: 3 flights     Home Equipment: None      Prior Function Prior Level of Function : Independent/Modified Independent                     Hand Dominance        Extremity/Trunk Assessment   Upper Extremity Assessment Upper Extremity Assessment:  (observed essential tremor UEs)  Lower Extremity Assessment Lower Extremity Assessment: Overall WFL for tasks assessed    Cervical / Trunk Assessment Cervical / Trunk Assessment: Normal  Communication   Communication: No difficulties  Cognition Arousal/Alertness: Awake/alert Behavior During Therapy: WFL for tasks assessed/performed Overall Cognitive Status: Within Functional Limits for tasks assessed                                           General Comments      Exercises     Assessment/Plan    PT Assessment Patient does not need any further PT services  PT Problem List         PT Treatment Interventions      PT Goals (Current goals can be found in the Care Plan section)  Acute Rehab PT Goals PT Goal Formulation: All assessment and education complete, DC therapy    Frequency     Barriers to discharge        Co-evaluation               AM-PAC PT "6 Clicks" Mobility  Outcome Measure Help needed turning from your back to your side while in a flat bed without using bedrails?: None Help needed moving from lying on your back to sitting on the side of a flat bed without using bedrails?: None Help needed moving to and from a bed to a chair (including a wheelchair)?: None Help needed standing up from a chair using your arms (e.g., wheelchair or bedside chair)?: None Help needed to walk in hospital room?: A Little Help needed climbing 3-5 steps with a railing? : A Little 6 Click Score: 22    End of Session Equipment Utilized During Treatment: Gait belt Activity Tolerance: Patient tolerated treatment well Patient left: in bed;with call bell/phone within reach Nurse Communication: Mobility status PT Visit Diagnosis: Difficulty in walking, not elsewhere classified (R26.2)    Time: 2130-8657 PT Time Calculation (min) (ACUTE ONLY): 16 min   Charges:   PT Evaluation $PT Eval Low Complexity: 1 Low     Kati PT, DPT Acute Rehabilitation Services Pager: [phone] Office: [phone]   Janan Halter P[place] 10/12/2021, 3:57 PM

## 2021-10-12 NOTE — Progress Notes (Signed)
Orthopedics Progress Note  Subjective: No change in right hip pain  Objective:  Vitals:   10/11/21 2132 10/12/21 0531  BP: 125/80 128/66  Pulse: 91 80  Resp:  16  Temp: 98.8 F (37.1 C) 99 F (37.2 C)  SpO2: 99% 100%    General: Awake and alert  Musculoskeletal: Minimal pain with gentle AROM of the hip, no calf swelling or tenderness, Neg homans Neurovascularly intact  Lab Results  Component Value Date   WBC 7.8 10/12/2021   HGB 11.0 (L) 10/12/2021   HCT 32.7 (L) 10/12/2021   MCV 85.6 10/12/2021   PLT 304 10/12/2021       Component Value Date/Time   NA 136 10/12/2021 0411   K 3.7 10/12/2021 0411   CL 101 10/12/2021 0411   CO2 27 10/12/2021 0411   GLUCOSE 123 (H) 10/12/2021 0411   BUN 11 10/12/2021 0411   CREATININE 0.67 10/12/2021 0411   CALCIUM 9.0 10/12/2021 0411   GFRNONAA >60 10/12/2021 0411    Lab Results  Component Value Date   INR 1.3 (H) 10/08/2021   INR 1.1 05/02/2021    Assessment/Plan: Right hip pain, improving with medical management Treatment plan per Dr Linna Caprice and ID team, broad spectrum abx to continue Ortho following   Almedia Balls. Sarah Patrick, MD 10/12/2021 7:00 AM

## 2021-10-12 NOTE — Progress Notes (Signed)
Patient refuses lab draws at 8 am 11/12

## 2021-10-12 NOTE — Progress Notes (Addendum)
Progress Note    Sarah Mcconnell   RKY:[id number]  DOB: 09-Nov-1961  DOA: 10/07/2021     3 PCP: [facility]  Initial CC: right hip pain  Hospital Course: Sarah Mcconnell is a 60 yo female with PMH Parkinson's disease, TIA, COPD, mild intermittent asthma, peripheral neuropathy who presented to the hospital with right hip pain.  She endorsed pain started approximately 1 to 2 days prior to admission and is felt worse in her right hip but some down towards her right lower thigh.  She has had difficulty walking due to this. In addition on admission, she admitted to having unprotected sexual intercourse for the first time in several years.  She expressed concern for possible infection related to this. She was found to have low-grade fever on work-up, 100.8.  WBC elevated, 12.1 initially.  CT right hip showed small right effusion.  She was recommended for MRI to better evaluate however declined.  Therefore, she was recommended to undergo joint aspiration with IR. She also underwent CT L-spine which showed mild disc bulge at L3-4 with no significant spinal stenosis.  Interval History:  No events overnight.  Rested well last night and slept better than she has in a couple days.  Understands plan for continuing IV antibiotics until Monday then discharging home at that time.  Assessment & Plan: * Acute right hip pain - Patient presenting with 2-day history of progressively worsening right hip pain; on arrival, patient found to be febrile with elevated inflammatory markers and leukocytosis -Differential includes septic arthritis versus inflammation due to underlying arthritic changes -CT right hip shows small effusion, patient declining MRI.  Follow-up joint aspiration from IR: no crystals, but due to clot unable to quantify WBC count, but 82% neutrophils noted. Concern is septic arthritis at this time - patient started on vanc and rocephin per ortho on 11/8 - GC/CHL is  negative -Appreciate ID consult.  Plan is for continuing vancomycin and Rocephin until 10/14/2021 then transitioning to doxycycline 100 mg twice daily and cefadroxil 500 mg twice daily for 4 weeks until 11/04/2021 with outpatient ID follow-up  Septic arthritis (HCC) - see right hip pain   History of sexual intercourse - Patient endorsed recent sexual intercourse prior to onset of right hip pain.  She endorsed intercourse was unprotected and was concerned for possible infection contributing to her pain - GC/CHL is negative (has resulted on 11/10) - HIV negative (though technically would need repeat in ~1 month if high concern as testing would not have had time for seroconversion)  Hypokalemia - Replete and recheck as needed   Sepsis (HCC) - leukocytosis, fever, source considered septic right hip  - See above  Parkinson disease (HCC) - Continue home Sinemet  COPD with asthma (HCC) - No signs or symptoms of exacerbation - Continue breathing treatments    Old records reviewed in assessment of this patient  Antimicrobials: Vancomycin 10/08/2021 >> current Rocephin 10/08/2021 >> current  DVT prophylaxis: holding in case of surgery in am; SCD for now   Code Status:   Code Status: Full Code  Disposition Plan: Home on Monday Status is: Inpatient   Objective: Blood pressure 128/66, pulse 80, temperature 99 F (37.2 C), temperature source Oral, resp. rate 16, height 5\' 9"  (1.753 m), weight 81.6 kg, SpO2 100 %.  Examination: Physical Exam Constitutional:      General: She is not in acute distress.    Appearance: Normal appearance.  HENT:     Mouth/Throat:  Mouth: Mucous membranes are moist.  Eyes:     Extraocular Movements: Extraocular movements intact.  Cardiovascular:     Rate and Rhythm: Normal rate and regular rhythm.  Pulmonary:     Effort: Pulmonary effort is normal.     Breath sounds: Normal breath sounds.  Abdominal:     General: Bowel sounds are normal. There  is no distension.     Palpations: Abdomen is soft.     Tenderness: There is no abdominal tenderness.  Musculoskeletal:     Cervical back: Normal range of motion and neck supple.     Comments: Pain with active/passive range of motion in the right hip (but improved from priors).  Skin:    General: Skin is warm and dry.  Neurological:     Mental Status: She is alert.     Comments: No true focal deficit, strength in right lower extremity limited by pain in hip (but strength has steadily improved over past 1-2 days) Upper extremity resting tremor is intermittent   Psychiatric:        Mood and Affect: Mood normal.        Behavior: Behavior normal.     Consultants:  Ortho IR ID  Procedures:  R hip joint aspiration, 10/08/21  Data Reviewed: I have personally reviewed labs and imaging studies No new findings    LOS: 3 days  Time spent: Greater than 50% of the 35 minute visit was spent in counseling/coordination of care for the patient as laid out in the A&P.   Lewie Chamber, MD [facility] 10/12/2021, 12:21 PM

## 2021-10-13 DIAGNOSIS — A419 Sepsis, unspecified organism: Principal | ICD-10-CM

## 2021-10-13 LAB — CULTURE, BLOOD (ROUTINE X 2): Culture: NO GROWTH

## 2021-10-13 MED ORDER — OXYCODONE-ACETAMINOPHEN 5-325 MG PO TABS: 1.0000 | ORAL_TABLET | Freq: Four times a day (QID) | ORAL | 0 refills | Status: AC | PRN

## 2021-10-13 MED ORDER — ROPINIROLE HCL 0.5 MG PO TABS: 0.5000 mg | ORAL_TABLET | Freq: Once | ORAL | Status: DC

## 2021-10-13 MED ORDER — CEFADROXIL 500 MG PO CAPS: 500.0000 mg | ORAL_CAPSULE | Freq: Two times a day (BID) | ORAL | 0 refills | Status: AC

## 2021-10-13 MED ORDER — DOXYCYCLINE HYCLATE 100 MG PO CAPS: 100.0000 mg | ORAL_CAPSULE | Freq: Two times a day (BID) | ORAL | 0 refills | Status: AC

## 2021-10-13 NOTE — Plan of Care (Signed)

## 2021-10-13 NOTE — Progress Notes (Signed)
Patient is shaking uncontrollable. Patient stated she has never had this happen before and is questioning if it is a medication reaction. Sinemet is the only medication given this shift. Messaged [employer]

## 2021-10-13 NOTE — Progress Notes (Signed)
Per Andi Devon Blount'sr request, I inquired if the patient takes sinemet at home and she said she does. She is not shaking now and stated she is better because she just too two pills of her home medication, which she stated were sinemet and Artane. I updated Audrea Muscat and will get the meds in lockup.

## 2021-10-13 NOTE — Progress Notes (Signed)
Patient is shaking uncontrollable. Patient does have Parkinson's but states she has never had this happen before and is questioning if it is a medication reaction. Sinemet is the only medication given this shift. Paged Audrea Muscat

## 2021-10-13 NOTE — Discharge Summary (Signed)
Physician Discharge Summary   Patient name: Sarah Mcconnell  Admit date:     10/07/2021  Discharge date: 10/13/2021  Discharge Physician: Lewie Chamber   PCP: [facility]   Recommendations at discharge: Follow up with ID  Discharge Diagnoses Principal Problem:   Acute right hip pain Active Problems:   Septic arthritis (HCC)   History of sexual intercourse   Hypokalemia   COPD with asthma (HCC)   Parkinson disease (HCC)   Resolved Diagnoses Resolved Problems:   Sepsis [facility])   Hospital Course   [name] is a 60 yo female with PMH Parkinson's disease, TIA, COPD, mild intermittent asthma, peripheral neuropathy who presented to the hospital with right hip pain.  She endorsed pain started approximately 1 to 2 days prior to admission and is felt worse in her right hip but some down towards her right lower thigh.  She has had difficulty walking due to this. In addition on admission, she admitted to having unprotected sexual intercourse for the first time in several years.  She expressed concern for possible infection related to this. She was found to have low-grade fever on work-up, 100.8.  WBC elevated, 12.1 initially.  CT right hip showed small right effusion.  She was recommended for MRI to better evaluate however declined.  Therefore, she was recommended to undergo joint aspiration with IR. She also underwent CT L-spine which showed mild disc bulge at L3-4 with no significant spinal stenosis.   * Acute right hip pain - Patient presenting with 2-day history of progressively worsening right hip pain; on arrival, patient found to be febrile with elevated inflammatory markers and leukocytosis -Differential includes septic arthritis versus inflammation due to underlying arthritic changes -CT right hip shows small effusion, patient declining MRI.  Follow-up joint aspiration from IR: no crystals, but due to clot unable to quantify WBC count, but 82%  neutrophils noted. Concern is septic arthritis at this time - patient started on vanc and rocephin per ortho on 11/8 - GC/CHL is negative -Appreciate ID consult.  Plan is for continuing vancomycin and Rocephin until 10/14/2021 then transitioning to doxycycline 100 mg twice daily and cefadroxil 500 mg twice daily for 4 weeks until 11/04/2021 with outpatient ID follow-up - patient did not wish to remain in hospital until 11/14, so she was discharged on 11/13 after abx doses and continued on above oral abx regimen; pain and strength/ROM were still improved and stable  Septic arthritis (HCC) - see right hip pain   History of sexual intercourse - Patient endorsed recent sexual intercourse prior to onset of right hip pain.  She endorsed intercourse was unprotected and was concerned for possible infection contributing to her pain - GC/CHL is negative (has resulted on 11/10) - HIV negative (though technically would need repeat in ~1 month if high concern as testing would not have had time for seroconversion)  Hypokalemia - Repleted   Sepsis (HCC)-resolved as of 10/13/2021 - leukocytosis, fever, source considered septic right hip  - See above  Parkinson disease (HCC) - Continue home Sinemet  COPD with asthma (HCC) - No signs or symptoms of exacerbation - Continue breathing treatments       Procedures performed:  R hip joint aspiration, 10/08/21  Condition at discharge: stable  Exam Physical Exam Constitutional:      General: She is not in acute distress.    Appearance: Normal appearance.  HENT:     Mouth/Throat:     Mouth: Mucous membranes are  moist.  Eyes:     Extraocular Movements: Extraocular movements intact.  Cardiovascular:     Rate and Rhythm: Normal rate and regular rhythm.  Pulmonary:     Effort: Pulmonary effort is normal.     Breath sounds: Normal breath sounds.  Abdominal:     General: Bowel sounds are normal. There is no distension.     Palpations: Abdomen is  soft.     Tenderness: There is no abdominal tenderness.  Musculoskeletal:     Cervical back: Normal range of motion and neck supple.     Comments: Pain with active/passive range of motion in the right hip (but improved from priors).  Skin:    General: Skin is warm and dry.  Neurological:     Mental Status: She is alert.     Comments: No true focal deficit, strength in right lower extremity limited by pain in hip (but strength has steadily improved over past 1-2 days) Upper extremity resting tremor is intermittent   Psychiatric:        Mood and Affect: Mood normal.        Behavior: Behavior normal.     Disposition: Home  Discharge time: greater than 30 minutes.  Follow-up Information     Danelle Earthly, MD. Schedule an appointment as soon as possible for a visit in 2 week(s).   Specialty: Internal Medicine Contact information: [address] [place], [address] [place] [address] [phone]                 Allergies as of 10/13/2021   No Known Allergies      Medication List     STOP taking these medications    predniSONE 10 MG tablet Commonly known as: DELTASONE       TAKE these medications    acetaminophen 500 MG tablet Commonly known as: TYLENOL Take 500 mg by mouth every 6 (six) hours as needed for mild pain or moderate pain.   albuterol 108 (90 Base) MCG/ACT inhaler Commonly known as: VENTOLIN HFA Inhale 1 puff into the lungs every 6 (six) hours as needed for shortness of breath.   carbidopa-levodopa 25-100 MG tablet Commonly known as: SINEMET IR Take 1 tablet by mouth in the morning and at bedtime.   cefadroxil 500 MG capsule Commonly known as: DURICEF Take 1 capsule (500 mg total) by mouth 2 (two) times daily for 22 days.   doxycycline 100 MG capsule Commonly known as: VIBRAMYCIN Take 1 capsule (100 mg total) by mouth 2 (two) times daily for 22 days.   oxyCODONE-acetaminophen 5-325 MG tablet Commonly known as: PERCOCET/ROXICET Take 1  tablet by mouth every 6 (six) hours as needed for moderate pain. What changed: reasons to take this   trihexyphenidyl 2 MG tablet Commonly known as: ARTANE Take 2 mg by mouth 2 (two) times daily with a meal.        DG Chest 1 View  Result Date: 10/08/2021 CLINICAL DATA:  Pneumonia EXAM: CHEST  1 VIEW COMPARISON:  None. FINDINGS: Generous heart size accentuated by technique. Negative aortic and hilar contours. No acute infiltrate or edema. No effusion or pneumothorax. No acute osseous findings. IMPRESSION: No active disease. Electronically Signed   By: Tiburcio Pea M.D.   On: 10/08/2021 06:48   CT Lumbar Spine Wo Contrast  Result Date: 10/08/2021 CLINICAL DATA:  Initial evaluation for acute low back pain, infection suspected. EXAM: CT LUMBAR SPINE WITHOUT CONTRAST TECHNIQUE: Multidetector CT imaging of the lumbar spine was performed without  intravenous contrast administration. Multiplanar CT image reconstructions were also generated. COMPARISON:  None available. FINDINGS: Segmentation: Standard. Lowest well-formed disc space labeled the L5-S1 level. Alignment: Mild levoscoliosis. Alignment otherwise normal preservation of the normal lumbar lordosis. No listhesis. Vertebrae: Vertebral body height maintained without acute or chronic fracture. Visualized sacrum and pelvis intact. SI joints symmetric and normal. No discrete or worrisome osseous lesions. No CT evidence for osteomyelitis discitis or septic arthritis. Paraspinal and other soft tissues: Unremarkable. No visible inflammatory changes. Mild aorto bi-iliac atherosclerotic disease. Disc levels: L1-2: Negative interspace. Mild to moderate facet hypertrophy. No significant spinal stenosis. Mild bilateral foraminal narrowing, greater on the left. L2-3: Negative interspace. Moderate bilateral facet hypertrophy. No significant spinal stenosis. Mild bilateral foraminal narrowing, worse on the left. L3-4: Mild disc bulge. Superimposed right foraminal  disc protrusion (series 4, image 104), potentially affecting the exiting right L3 nerve root. Mild to moderate facet hypertrophy. No significant spinal stenosis. Foramina remain patent. L4-5: Mild annular disc bulge. Mild to moderate facet hypertrophy. No significant spinal stenosis. Foramina remain patent. L5-S1: Mild disc bulge. Moderate bilateral facet hypertrophy. Mild narrowing of the lateral recesses bilaterally. Central canal remains widely patent. No significant foraminal encroachment. IMPRESSION: 1. No CT evidence for acute abnormality within the lumbar spine. Specifically, no findings to suggest acute infection identified. 2. Right foraminal disc protrusion at L3-4, potentially affecting the exiting right L3 nerve root. 3. Mild disc bulging with moderate facet hypertrophy at L2-3 through L5-S1 without significant stenosis. Electronically Signed   By: Rise Mu M.D.   On: 10/08/2021 03:52   CT HIP RIGHT W CONTRAST  Result Date: 10/08/2021 CLINICAL DATA:  Right hip pain, concern for septic arthritis EXAM: CT OF THE LOWER RIGHT EXTREMITY WITH CONTRAST TECHNIQUE: Multidetector CT imaging of the lower right extremity was performed according to the standard protocol following intravenous contrast administration. CONTRAST:  69mL OMNIPAQUE IOHEXOL 350 MG/ML SOLN COMPARISON:  None. FINDINGS: No fracture or dislocation is seen. Right hip joint space is preserved. Trace right hip joint fluid collection (series 4/images 36 and 43). Visualized soft tissues are otherwise unremarkable. IMPRESSION: Trace right hip effusion. No acute osseous abnormality is seen. Electronically Signed   By: Charline Bills M.D.   On: 10/08/2021 03:18   DG FLUORO GUIDED NEEDLE PLC ASPIRATION/INJECTION LOC  Result Date: 10/08/2021 CLINICAL DATA:  Right hip pain with fluid on CT. Evaluate for septic joint. EXAM: RIGHT HIP ASPIRATION FLUOROSCOPY COMPARISON:  CT 10/08/2021 FLUOROSCOPY TIME:  Fluoroscopy Time:  1 minute and  18 seconds Radiation Exposure Index (if provided by the fluoroscopic device): 23.3 mGy Number of Acquired Spot Images: 0 PROCEDURE: Informed written and verbal consent were obtained. A time-out was performed. Overlying skin prepped with Betadine, draped in the usual sterile fashion, and infiltrated locally with buffered Lidocaine. Curved 18 gauge spinal needle advanced to the superolateral margin of the right femoral head. Initial return of purulent fluid, on the order of 5 cc. Subsequent return of 3-5 cc sanguinous fluid. Subsequently, 10 cc of sterile water were injected and aspirated. Both samples were sent to lab at clinical service request. IMPRESSION: Right hip aspiration under fluoroscopic guidance. Initial return of purulent fluid, suspicious for septic joint. Electronically Signed   By: Jeronimo Greaves M.D.   On: 10/08/2021 16:07   DG Hip Unilat W or Wo Pelvis 1 View Right  Result Date: 10/07/2021 CLINICAL DATA:  Right hip pain. EXAM: DG HIP (WITH OR WITHOUT PELVIS) 1V RIGHT COMPARISON:  None. FINDINGS: There is no  acute fracture or dislocation. Mild bilateral hip arthritic changes. The soft tissues are unremarkable. IMPRESSION: No acute fracture or dislocation. Electronically Signed   By: Elgie Collard M.D.   On: 10/07/2021 21:00   DG FEMUR PORT, MIN 2 VIEWS RIGHT  Result Date: 10/08/2021 CLINICAL DATA:  RIGHT hip and thigh pain EXAM: RIGHT FEMUR PORTABLE 2 VIEW COMPARISON:  RIGHT hip radiographs 10/07/2021 FINDINGS: Osseous demineralization. Mild joint space narrowing at RIGHT hip and RIGHT knee. No acute fracture, dislocation, or bone destruction. IMPRESSION: Osseous demineralization with mild degenerative changes at RIGHT hip and RIGHT knee. No acute abnormalities. Electronically Signed   By: Ulyses Southward M.D.   On: 10/08/2021 18:59   Results for orders placed or performed during the hospital encounter of 10/07/21  Resp Panel by RT-PCR (Flu A&B, Covid) Nasopharyngeal Swab     Status: None    Collection Time: 10/07/21 11:23 PM   Specimen: Nasopharyngeal Swab; Nasopharyngeal(NP) swabs in vial transport medium  Result Value Ref Range Status   SARS Coronavirus 2 by RT PCR NEGATIVE NEGATIVE Final    Comment: (NOTE) SARS-CoV-2 target nucleic acids are NOT DETECTED.  The SARS-CoV-2 RNA is generally detectable in upper respiratory specimens during the acute phase of infection. The lowest concentration of SARS-CoV-2 viral copies this assay can detect is 138 copies/mL. A negative result does not preclude SARS-Cov-2 infection and should not be used as the sole basis for treatment or other patient management decisions. A negative result may occur with  improper specimen collection/handling, submission of specimen other than nasopharyngeal swab, presence of viral mutation(s) within the areas targeted by this assay, and inadequate number of viral copies(<138 copies/mL). A negative result must be combined with clinical observations, patient history, and epidemiological information. The expected result is Negative.  Fact Sheet for Patients:  BloggerCourse.com  Fact Sheet for Healthcare Providers:  SeriousBroker.it  This test is no t yet approved or cleared by the [place] FDA and  has been authorized for detection and/or diagnosis of SARS-CoV-2 by FDA under an Emergency Use Authorization (EUA). This EUA will remain  in effect (meaning this test can be used) for the duration of the COVID-19 declaration under Section 564(b)(1) of the Act, 21 U.S.C.section 360bbb-3(b)(1), unless the authorization is terminated  or revoked sooner.       Influenza A by PCR NEGATIVE NEGATIVE Final   Influenza B by PCR NEGATIVE NEGATIVE Final    Comment: (NOTE) The Xpert Xpress SARS-CoV-2/FLU/RSV plus assay is intended as an aid in the diagnosis of influenza from Nasopharyngeal swab specimens and should not be used as a sole basis for treatment.  Nasal washings and aspirates are unacceptable for Xpert Xpress SARS-CoV-2/FLU/RSV testing.  Fact Sheet for Patients: BloggerCourse.com  Fact Sheet for Healthcare Providers: SeriousBroker.it  This test is not yet approved or cleared by the [place] FDA and has been authorized for detection and/or diagnosis of SARS-CoV-2 by FDA under an Emergency Use Authorization (EUA). This EUA will remain in effect (meaning this test can be used) for the duration of the COVID-19 declaration under Section 564(b)(1) of the Act, 21 U.S.C. section 360bbb-3(b)(1), unless the authorization is terminated or revoked.  Performed at [facility], [address], [place], [place] [address]   Blood culture (routine x 2)     Status: None   Collection Time: 10/08/21 12:05 AM   Specimen: BLOOD  Result Value Ref Range Status   Specimen Description   Final    BLOOD RIGHT ANTECUBITAL Performed  at [facility], [address], [place], [place] [address]    Special Requests   Final    BOTTLES DRAWN AEROBIC AND ANAEROBIC Blood Culture results may not be optimal due to an inadequate volume of blood received in culture bottles Performed at [facility], [address] [place], [place] [address]    Culture   Final    NO GROWTH 5 DAYS Performed at [facility], [address], [place], [place] [address]    Report Status 10/13/2021 FINAL  Final  Body fluid culture w Gram Stain     Status: None   Collection Time: 10/08/21  4:00 PM   Specimen: Joint, Hip; Body Fluid  Result Value Ref Range Status   Specimen Description   Final    HIP RIGHT Performed at [facility], [address] [place], [place] [address]    Special Requests   Final    NONE Performed at [facility], [address] [place], [place] [address]    Gram Stain   Final    NO SQUAMOUS EPITHELIAL CELLS  SEEN MODERATE WBC SEEN NO ORGANISMS SEEN    Culture   Final    NO GROWTH 3 DAYS Performed at [facility], [address], [place], [place] [address]    Report Status 10/12/2021 FINAL  Final    Signed:  Lewie Chamber MD.  [facility] 10/13/2021, 12:06 PM

## 2021-10-13 NOTE — TOC Transition Note (Signed)
Transition of Care [facility]) - CM/SW Discharge Note   Patient Details  Name: Sarah Mcconnell MRN: [id number] Date of Birth: 03-03-1961  [facility] CM/SW Contact:  Darleene Cleaver, LCSW Phone Number: 10/13/2021, 11:01 AM   Clinical Narrative:     Patient is followed by [facility], her social worker is [place], [phone].  CSW called and left a message on voice mail informing her that patient is discharging today, and does not have any other needs.  CSW signing off, please reconsult with social work needs.   Final next level of care: Home/Self Care Barriers to Discharge: Barriers Resolved   Patient Goals and CMS Choice Patient states their goals for this hospitalization and ongoing recovery are:: To return back home and use [facility] services.      Discharge Placement                       Discharge Plan and Services In-house Referral: Clinical Social Work                                   Social Determinants of Health (SDOH) Interventions     Readmission Risk Interventions No flowsheet data found.

## 2021-10-14 ENCOUNTER — Telehealth: Payer: Self-pay

## 2021-10-14 NOTE — Telephone Encounter (Signed)
Called patient to get a follow up scheduled with Dr. Thedore Mins the week of 12/5, no answer and voicemail is full

## 2021-11-07 ENCOUNTER — Ambulatory Visit: Payer: Medicaid Other

## 2021-12-16 ENCOUNTER — Inpatient Hospital Stay: Payer: Medicaid Other | Admitting: Internal Medicine

## 2023-10-03 IMAGING — CT CT HIP*R* W/CM
3 series · 14 of 33 positions shown, 17 images · IV contrast (omnipaque)
Comparison: None.

CLINICAL DATA: Right hip pain, concern for septic arthritis

EXAM:
CT OF THE LOWER RIGHT EXTREMITY WITH CONTRAST
TECHNIQUE: Multidetector CT imaging of the lower right extremity was performed
according to the standard protocol following intravenous contrast
administration.
CONTRAST:  80mL OMNIPAQUE IOHEXOL 350 MG/ML SOLN

[Series 4: axial st · axial · 0.53mm/px · z∈[+1100,+1240]mm · 6 of 92 slices shown, 8 images]
[im 15/92  soft-tissue]
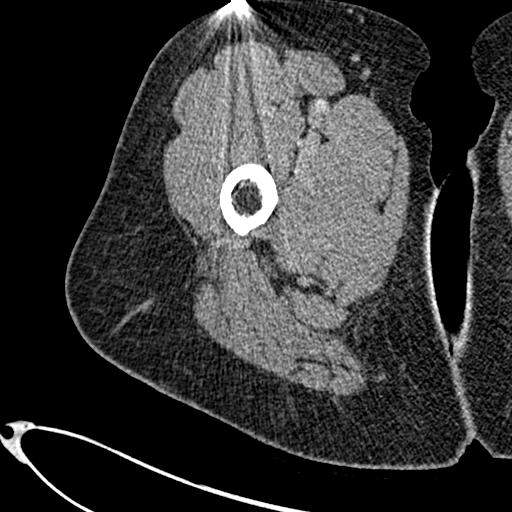
[im 15/92  bone]
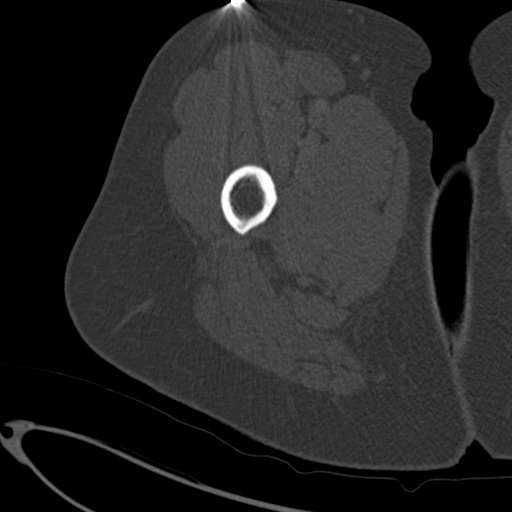
[im 29/92  bone]
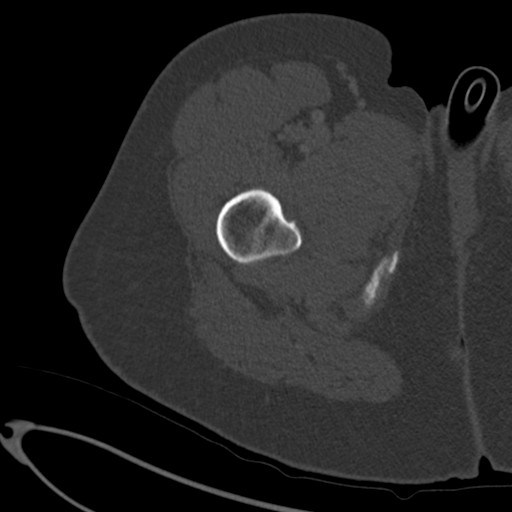
[im 43/92  bone]
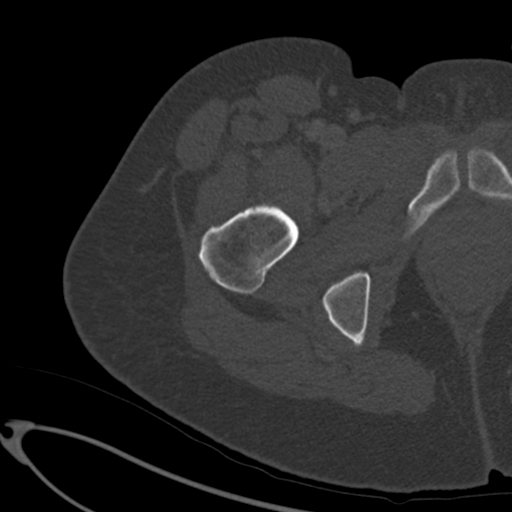
[im 57/92  bone]
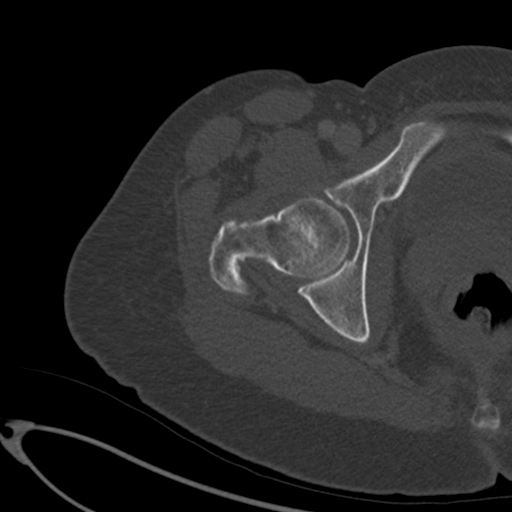
[im 71/92  soft-tissue]
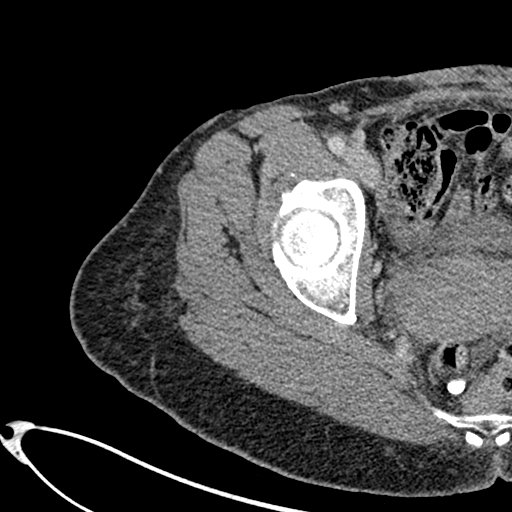
[im 71/92  bone]
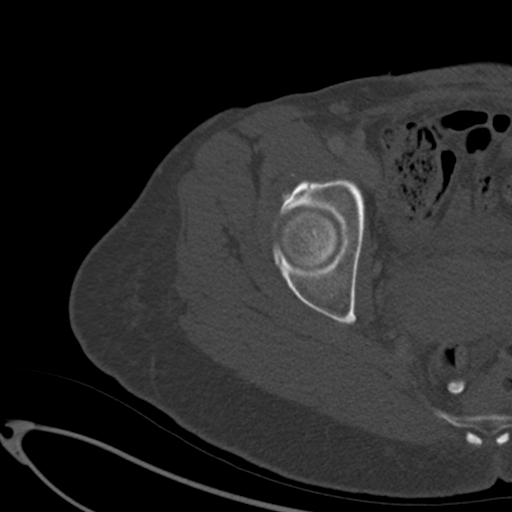
[im 85/92  bone]
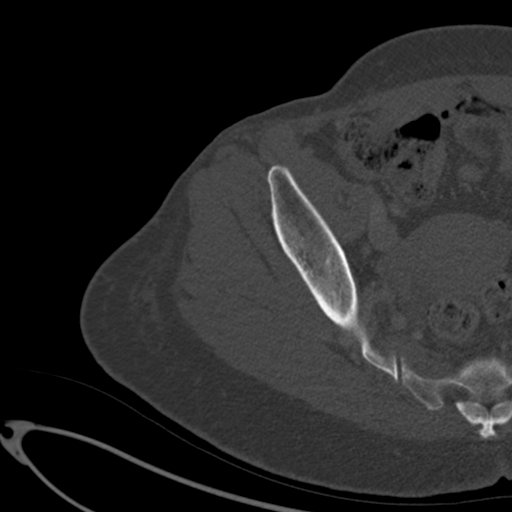

[Series 7: coronal st · coronal · 0.39mm/px · 3 of 137 slices shown]
[im 28/137  bone]
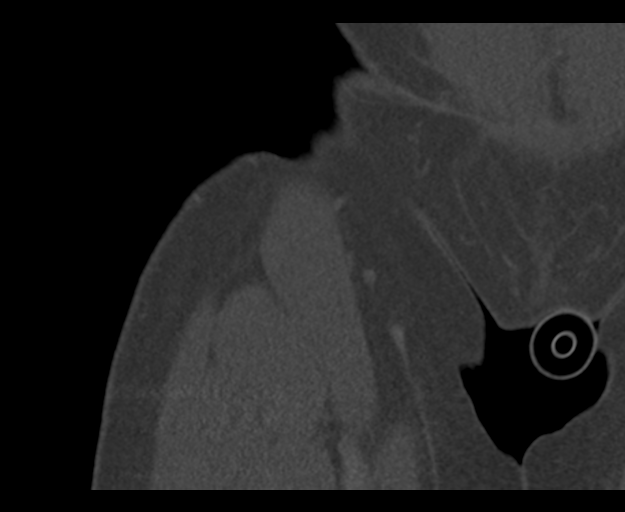
[im 55/137  bone]
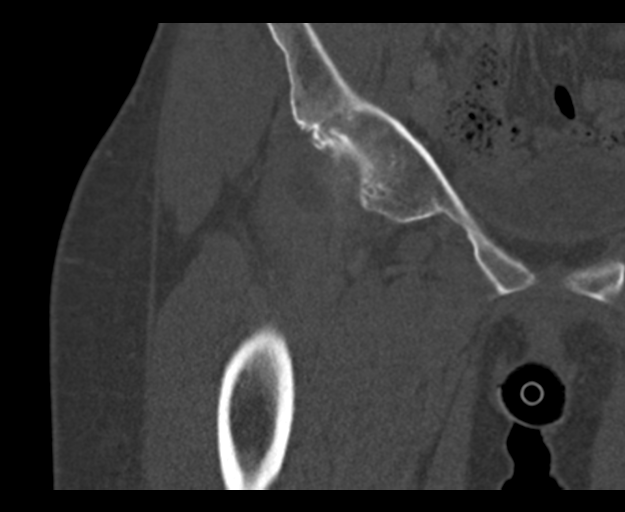
[im 82/137  bone]
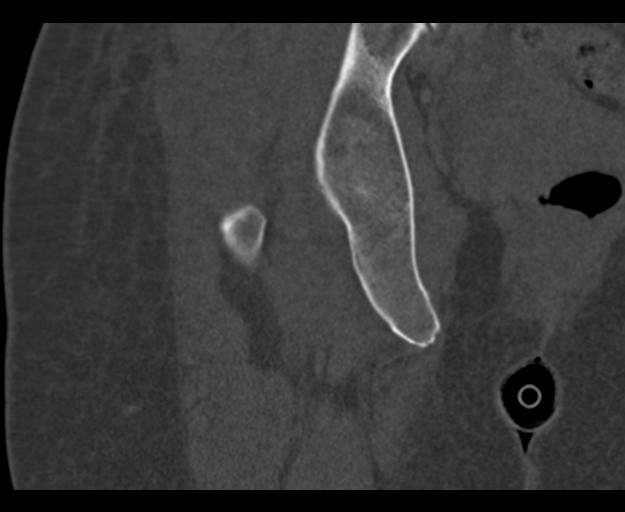

[Series 8: sagittal st · sagittal · 0.39mm/px · 5 of 115 slices shown, 6 images]
[im 39/115  bone]
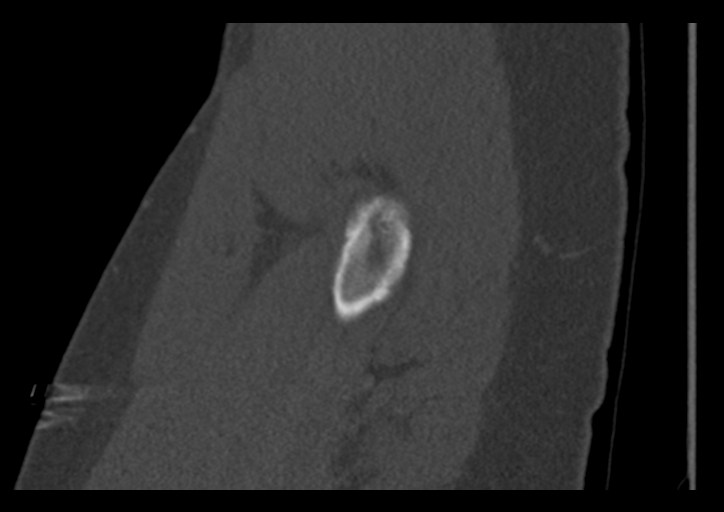
[im 48/115  bone]
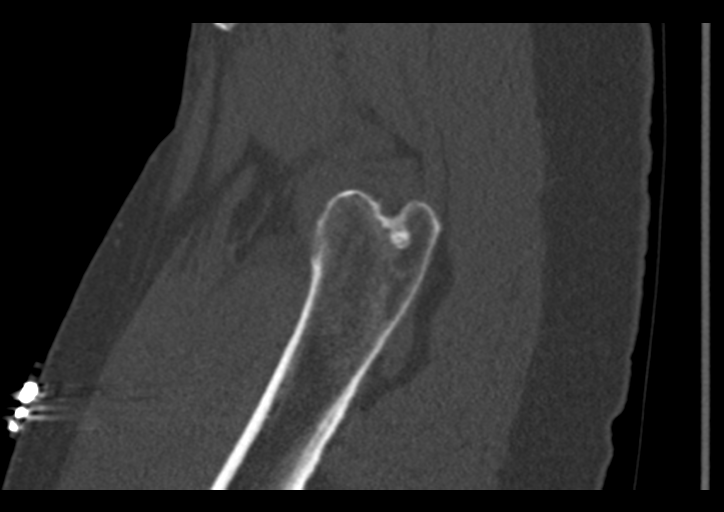
[im 58/115  soft-tissue]
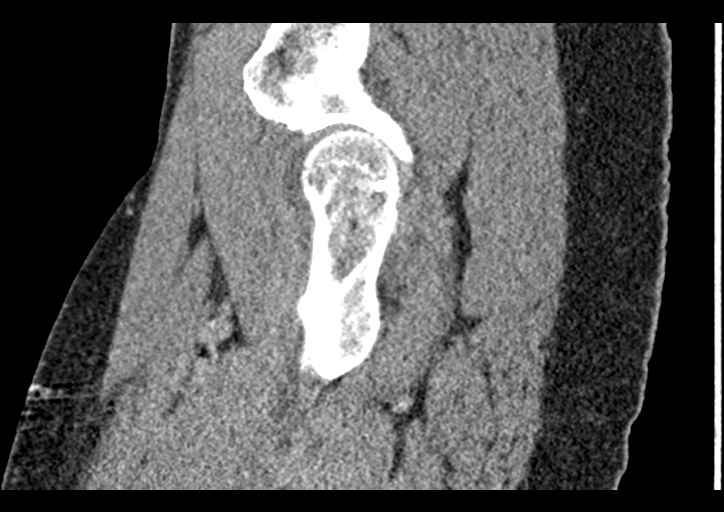
[im 58/115  bone]
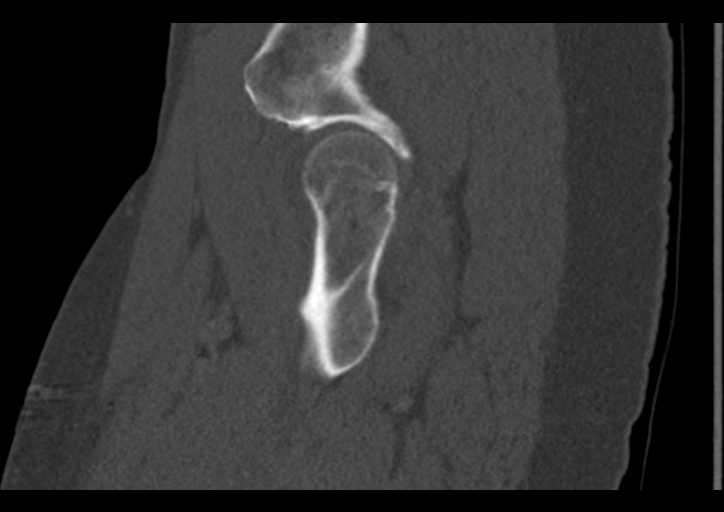
[im 67/115  bone]
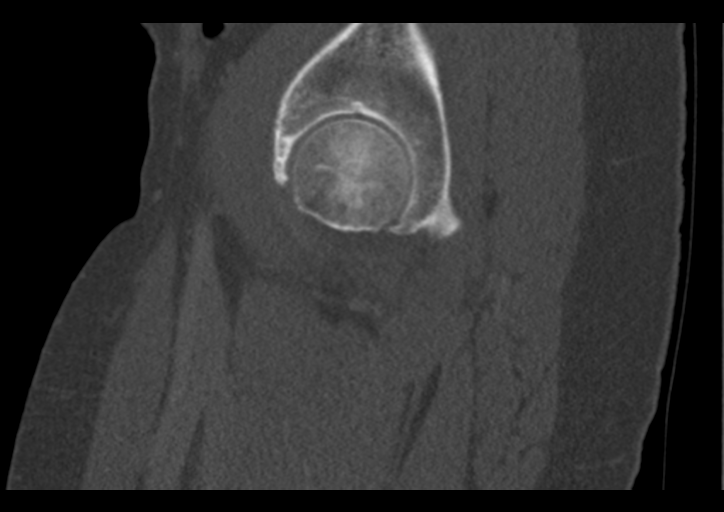
[im 77/115  bone]
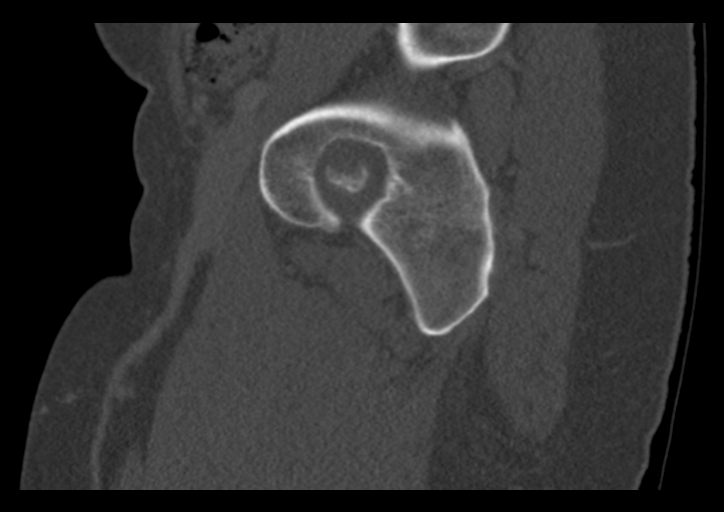

[14 of 33 positions shown; findings below may reference images not displayed]

FINDINGS: No fracture or dislocation is seen.

Right hip joint space is preserved.

Trace right hip joint fluid collection (series 4/images 36 and 43).

Visualized soft tissues are otherwise unremarkable.
IMPRESSION: Trace right hip effusion.

No acute osseous abnormality is seen.

## 2023-10-03 IMAGING — DX DG FEMUR 2+V PORT*R*
4 series · 4 of 4 positions shown · non-contrast
Comparison: RIGHT hip radiographs 10/07/2021

CLINICAL DATA: RIGHT hip and thigh pain

EXAM:
RIGHT FEMUR PORTABLE 2 VIEW

[femur lat (1 of 2)]
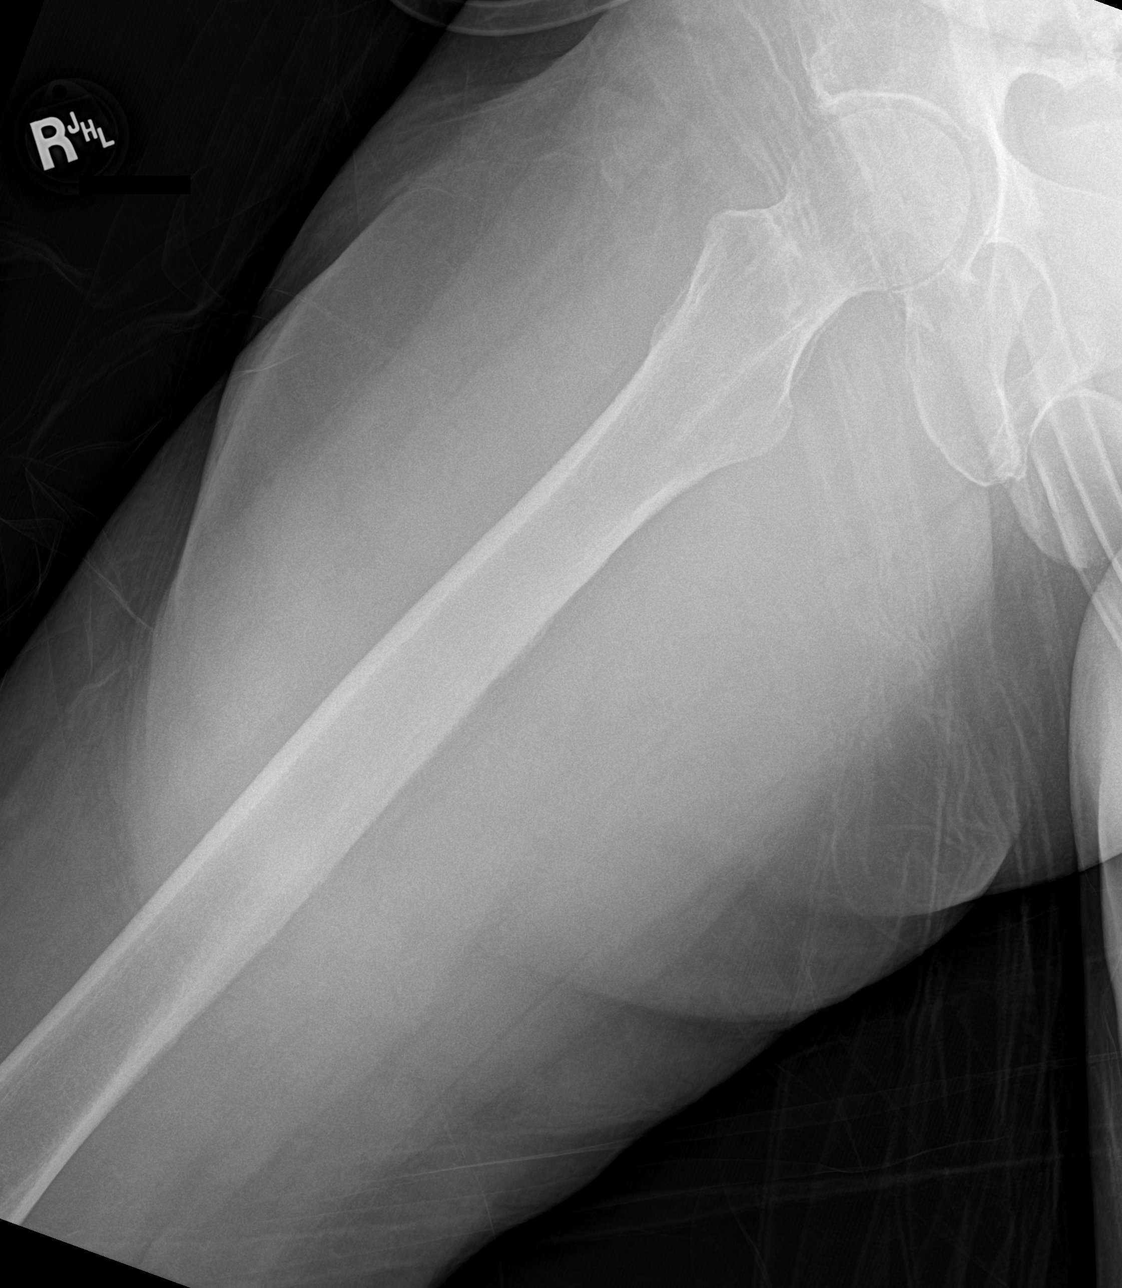

[femur lat (2 of 2)]
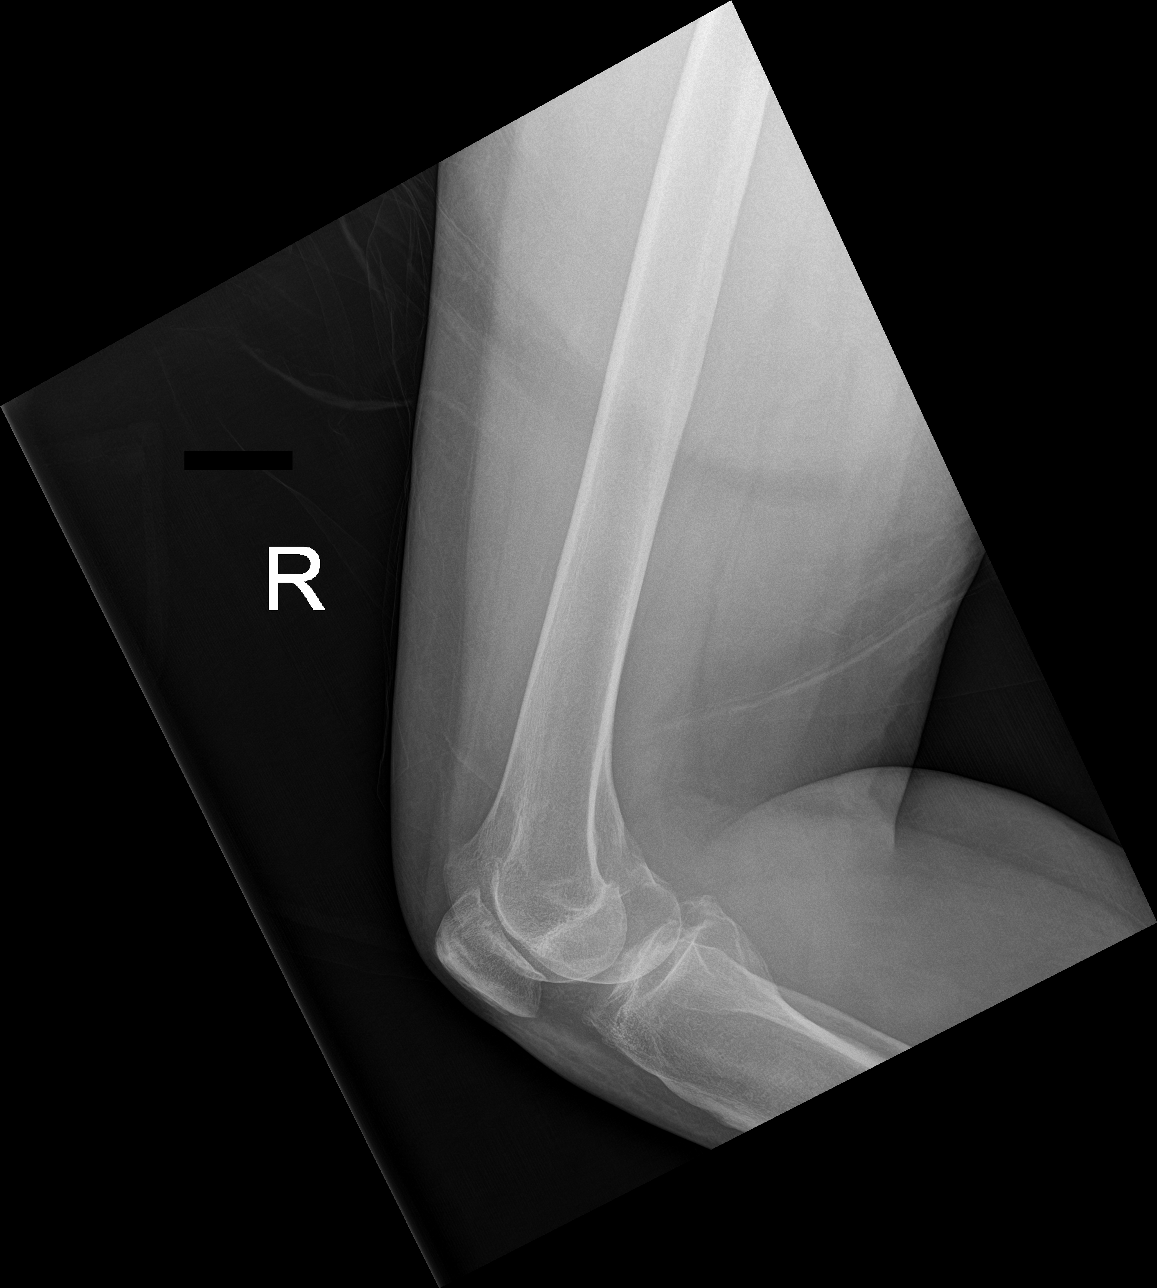

[femur ap proximal (1 of 2)]
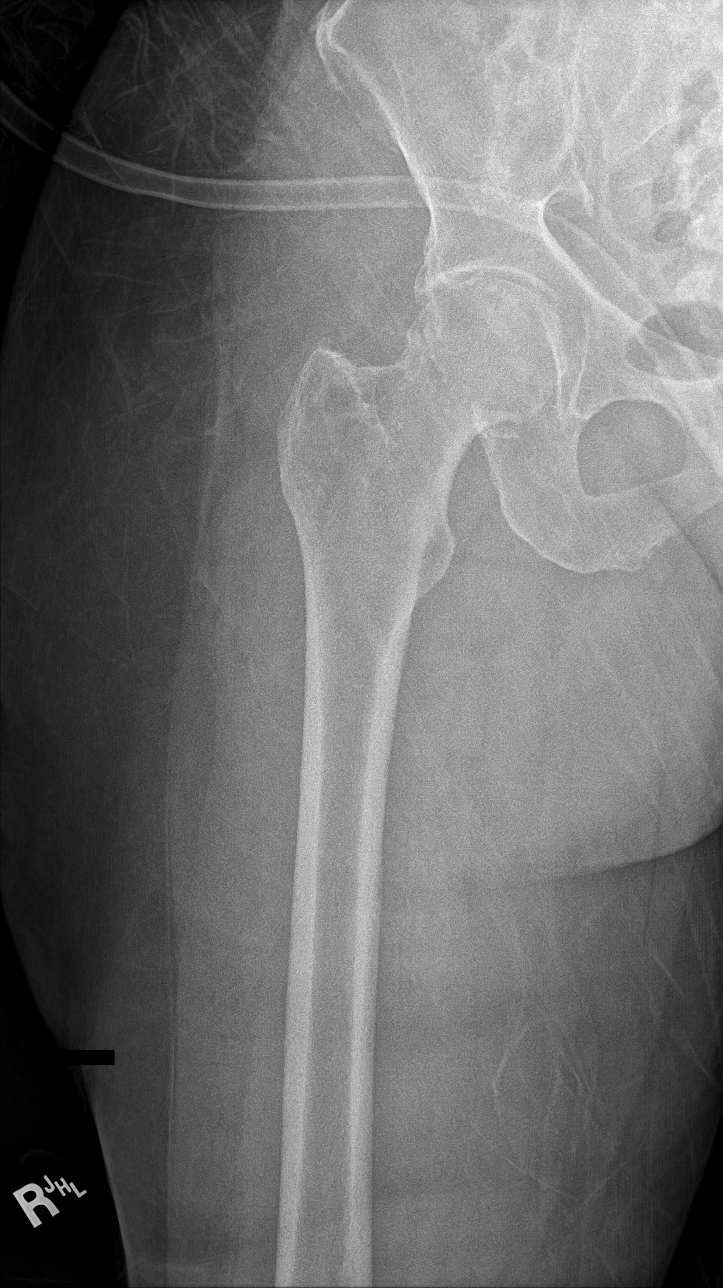

[femur ap proximal (2 of 2)]
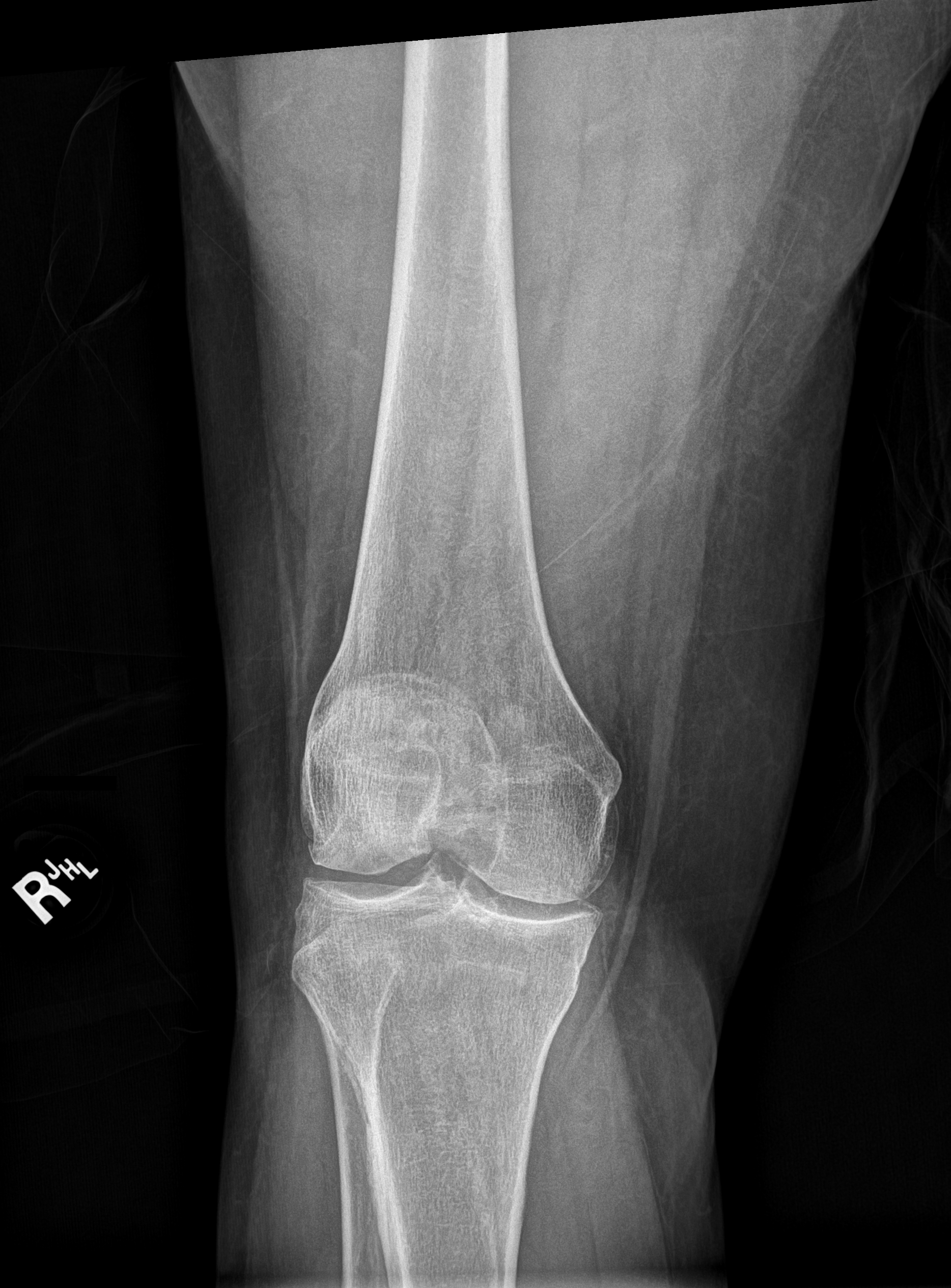

[4 of 4 positions shown; findings below may reference images not displayed]

FINDINGS: Osseous demineralization.

Mild joint space narrowing at RIGHT hip and RIGHT knee.

No acute fracture, dislocation, or bone destruction.
IMPRESSION: Osseous demineralization with mild degenerative changes at RIGHT hip
and RIGHT knee.

No acute abnormalities.
